# Patient Record
Sex: Female | Born: 1990 | Race: Black or African American | Hispanic: No | Marital: Single | Smoking: Never smoker
Health system: Southern US, Community
[De-identification: ages and names within clinical notes are randomized; demographics above are authoritative.]

## PROBLEM LIST (undated history)

## (undated) DIAGNOSIS — R7303 Prediabetes: Secondary | ICD-10-CM

## (undated) HISTORY — DX: Prediabetes: R73.03

---
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
## 2012-05-07 ENCOUNTER — Encounter (HOSPITAL_BASED_OUTPATIENT_CLINIC_OR_DEPARTMENT_OTHER): Payer: Self-pay | Admitting: Emergency Medicine

## 2012-05-07 ENCOUNTER — Emergency Department (HOSPITAL_BASED_OUTPATIENT_CLINIC_OR_DEPARTMENT_OTHER): Payer: No Typology Code available for payment source

## 2012-05-07 ENCOUNTER — Emergency Department (HOSPITAL_BASED_OUTPATIENT_CLINIC_OR_DEPARTMENT_OTHER)
Admission: EM | Admit: 2012-05-07 | Discharge: 2012-05-07 | Disposition: A | Discharge status: Home / Self Care | Payer: No Typology Code available for payment source | Source: Home / Self Care | Attending: Emergency Medicine | Admitting: Emergency Medicine

## 2012-05-07 ENCOUNTER — Emergency Department (HOSPITAL_BASED_OUTPATIENT_CLINIC_OR_DEPARTMENT_OTHER)
Admission: EM | Admit: 2012-05-07 | Discharge: 2012-05-07 | Disposition: A | Discharge status: Home / Self Care | Payer: No Typology Code available for payment source | Attending: Emergency Medicine | Admitting: Emergency Medicine

## 2012-05-07 DIAGNOSIS — M25539 Pain in unspecified wrist: Secondary | ICD-10-CM

## 2012-05-07 DIAGNOSIS — T148XXA Other injury of unspecified body region, initial encounter: Secondary | ICD-10-CM | POA: Insufficient documentation

## 2012-05-07 DIAGNOSIS — M542 Cervicalgia: Secondary | ICD-10-CM | POA: Insufficient documentation

## 2012-05-07 DIAGNOSIS — R079 Chest pain, unspecified: Secondary | ICD-10-CM | POA: Insufficient documentation

## 2012-05-07 MED ORDER — METHOCARBAMOL 500 MG PO TABS: 500.0000 mg | ORAL_TABLET | Freq: Two times a day (BID) | ORAL | Status: AC

## 2012-05-07 MED ORDER — IBUPROFEN 800 MG PO TABS: 800.0000 mg | ORAL_TABLET | Freq: Three times a day (TID) | ORAL | Status: AC

## 2012-05-07 MED ORDER — ALBUTEROL SULFATE (5 MG/ML) 0.5% IN NEBU
INHALATION_SOLUTION | RESPIRATORY_TRACT | Status: AC
  Filled 2012-05-07: qty 1

## 2012-05-07 MED ORDER — IBUPROFEN 400 MG PO TABS
400.0000 mg | ORAL_TABLET | Freq: Once | ORAL | Status: AC
  Administered 2012-05-07: 400 mg via ORAL
  Filled 2012-05-07: qty 1

## 2012-05-07 MED ORDER — IPRATROPIUM BROMIDE 0.02 % IN SOLN
RESPIRATORY_TRACT | Status: AC
  Filled 2012-05-07: qty 2.5
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
## 2012-05-08 NOTE — ED Provider Notes (Signed)
Medical screening examination/treatment/procedure(s) were conducted as a shared visit with non-physician practitioner(s) and myself.  I personally evaluated the patient during the encounter   Jo Sprout, MD 05/08/12 1154
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
## 2014-11-24 ENCOUNTER — Emergency Department (INDEPENDENT_AMBULATORY_CARE_PROVIDER_SITE_OTHER)
Admission: EM | Admit: 2014-11-24 | Discharge: 2014-11-24 | Disposition: A | Discharge status: Home / Self Care | Payer: Medicaid Other | Source: Home / Self Care | Attending: Emergency Medicine | Admitting: Emergency Medicine

## 2014-11-24 ENCOUNTER — Encounter (HOSPITAL_COMMUNITY): Payer: Self-pay | Admitting: *Deleted

## 2014-11-24 DIAGNOSIS — J069 Acute upper respiratory infection, unspecified: Secondary | ICD-10-CM

## 2014-11-24 DIAGNOSIS — H6092 Unspecified otitis externa, left ear: Secondary | ICD-10-CM

## 2014-11-24 MED ORDER — HYDROCORTISONE-ACETIC ACID 1-2 % OT SOLN: 3.0000 [drp] | Freq: Three times a day (TID) | OTIC | Status: DC

## 2014-11-24 NOTE — Discharge Instructions (Signed)
Acetic Acid Otic Solution Use Acetic acid is used to treatment infections of the outer ear caused by bacteria and fungi. It should not be used with a perforated eardrum. Use only in the ear. It usually helps relieve the itching and discomfort that comes from an outer ear infection. PROCEDURE  Insert the drops when they are warm or at room temperature. In adults, hold the ear up and back. With children, pull the earlobe down and back. This makes it easier for the drops to go in. Then lie with the affected side up for about 5 minutes.  Getting help from someone may make the task easier.  You or your caregiver may put in a wick of cotton. This helps the drops get in contact with the infected surfaces. The wick may be saturated with the solution before putting it into the ear canal. Or, drops can be put in after the wick is in. Do whatever is easiest. RISKS & COMPLICATIONS  Stop using the drops immediately if pain or irritation occurs. There may be temporary stinging or burning when the solution is first put into the sore ear. Stop the drops if the discomfort is too bad and notify your caregiver. AFTER THE PROCEDURE  Avoid driving or doing things that could cause you injury if you feel dizziness or other side effects from the medication. HOME CARE INSTRUCTIONS   Leave the wick in for 24 hours or as instructed. Continue the drops as instructed.  Generally you can keep the wick moist by adding 3 to 5 drops of the solution every 4 to 6 hours. In pediatric patients, 3 to 4 drops every 4 to 6 hours may be enough. They have a smaller ear canal.  If a dose is missed, use the missed dose when you remember. If it is almost time for the next dose, skip the missed dose and use the next regularly scheduled dose. SEEK IMMEDIATE MEDICAL CARE IF:  You have an allergic reaction with difficulty breathing or shortness of breath.  You have swelling of the throat, lips, tongue, or face.  You have a rash or  hives.  You have increased drainage or pain from your ear. MAKE SURE YOU:   Understand these instructions.  Will watch your condition.  Will get help right away if you are not doing well or get worse. Document Released: 03/16/2007 Document Revised: 02/21/2012 Document Reviewed: 11/17/2007 [facility] Patient Information 2015 [place], [place]. This information is not intended to replace advice given to you by your health care provider. Make sure you discuss any questions you have with your health care provider.  Otitis Externa Otitis externa is a germ infection in the outer ear. The outer ear is the area from the eardrum to the outside of the ear. Otitis externa is sometimes called "swimmer's ear." HOME CARE  Put drops in the ear as told by your doctor.  Only take medicine as told by your doctor.  If you have diabetes, your doctor may give you more directions. Follow your doctor's directions.  Keep all doctor visits as told. To avoid another infection:  Keep your ear dry. Use the corner of a towel to dry your ear after swimming or bathing.  Avoid scratching or putting things inside your ear.  Avoid swimming in lakes, dirty water, or pools that use a chemical called chlorine poorly.  You may use ear drops after swimming. Combine equal amounts of white vinegar and alcohol in a bottle. Put 3 or 4 drops in  each ear. GET HELP IF:   You have a fever.  Your ear is still red, puffy (swollen), or painful after 3 days.  You still have yellowish-white fluid (pus) coming from the ear after 3 days.  Your redness, puffiness, or pain gets worse.  You have a really bad headache.  You have redness, puffiness, pain, or tenderness behind your ear. MAKE SURE YOU:   Understand these instructions.  Will watch your condition.  Will get help right away if you are not doing well or get worse. Document Released: 05/17/2008 Document Revised: 04/15/2014 Document Reviewed: 12/16/2011 [facility]  Patient Information 2015 [place], [place]. This information is not intended to replace advice given to you by your health care provider. Make sure you discuss any questions you have with your health care provider.  Upper Respiratory Infection, Adult An upper respiratory infection (URI) is also sometimes known as the common cold. The upper respiratory tract includes the nose, sinuses, throat, trachea, and bronchi. Bronchi are the airways leading to the lungs. Most people improve within 1 week, but symptoms can last up to 2 weeks. A residual cough may last even longer.  CAUSES Many different viruses can infect the tissues lining the upper respiratory tract. The tissues become irritated and inflamed and often become very moist. Mucus production is also common. A cold is contagious. You can easily spread the virus to others by oral contact. This includes kissing, sharing a glass, coughing, or sneezing. Touching your mouth or nose and then touching a surface, which is then touched by another person, can also spread the virus. SYMPTOMS  Symptoms typically develop 1 to 3 days after you come in contact with a cold virus. Symptoms vary from person to person. They may include:  Runny nose.  Sneezing.  Nasal congestion.  Sinus irritation.  Sore throat.  Loss of voice (laryngitis).  Cough.  Fatigue.  Muscle aches.  Loss of appetite.  Headache.  Low-grade fever. DIAGNOSIS  You might diagnose your own cold based on familiar symptoms, since most people get a cold 2 to 3 times a year. Your caregiver can confirm this based on your exam. Most importantly, your caregiver can check that your symptoms are not due to another disease such as strep throat, sinusitis, pneumonia, asthma, or epiglottitis. Blood tests, throat tests, and X-rays are not necessary to diagnose a common cold, but they may sometimes be helpful in excluding other more serious diseases. Your caregiver will decide if any further tests are  required. RISKS AND COMPLICATIONS  You may be at risk for a more severe case of the common cold if you smoke cigarettes, have chronic heart disease (such as heart failure) or lung disease (such as asthma), or if you have a weakened immune system. The very young and very old are also at risk for more serious infections. Bacterial sinusitis, middle ear infections, and bacterial pneumonia can complicate the common cold. The common cold can worsen asthma and chronic obstructive pulmonary disease (COPD). Sometimes, these complications can require emergency medical care and may be life-threatening. PREVENTION  The best way to protect against getting a cold is to practice good hygiene. Avoid oral or hand contact with people with cold symptoms. Wash your hands often if contact occurs. There is no clear evidence that vitamin C, vitamin E, echinacea, or exercise reduces the chance of developing a cold. However, it is always recommended to get plenty of rest and practice good nutrition. TREATMENT  Treatment is directed at relieving symptoms. There is  no cure. Antibiotics are not effective, because the infection is caused by a virus, not by bacteria. Treatment may include:  Increased fluid intake. Sports drinks offer valuable electrolytes, sugars, and fluids.  Breathing heated mist or steam (vaporizer or shower).  Eating chicken soup or other clear broths, and maintaining good nutrition.  Getting plenty of rest.  Using gargles or lozenges for comfort.  Controlling fevers with ibuprofen or acetaminophen as directed by your caregiver.  Increasing usage of your inhaler if you have asthma. Zinc gel and zinc lozenges, taken in the first 24 hours of the common cold, can shorten the duration and lessen the severity of symptoms. Pain medicines may help with fever, muscle aches, and throat pain. A variety of non-prescription medicines are available to treat congestion and runny nose. Your caregiver can make  recommendations and may suggest nasal or lung inhalers for other symptoms.  HOME CARE INSTRUCTIONS   Only take over-the-counter or prescription medicines for pain, discomfort, or fever as directed by your caregiver.  Use a warm mist humidifier or inhale steam from a shower to increase air moisture. This may keep secretions moist and make it easier to breathe.  Drink enough water and fluids to keep your urine clear or pale yellow.  Rest as needed.  Return to work when your temperature has returned to normal or as your caregiver advises. You may need to stay home longer to avoid infecting others. You can also use a face mask and careful hand washing to prevent spread of the virus. SEEK MEDICAL CARE IF:   After the first few days, you feel you are getting worse rather than better.  You need your caregiver's advice about medicines to control symptoms.  You develop chills, worsening shortness of breath, or brown or red sputum. These may be signs of pneumonia.  You develop yellow or brown nasal discharge or pain in the face, especially when you bend forward. These may be signs of sinusitis.  You develop a fever, swollen neck glands, pain with swallowing, or white areas in the back of your throat. These may be signs of strep throat. SEEK IMMEDIATE MEDICAL CARE IF:   You have a fever.  You develop severe or persistent headache, ear pain, sinus pain, or chest pain.  You develop wheezing, a prolonged cough, cough up blood, or have a change in your usual mucus (if you have chronic lung disease).  You develop sore muscles or a stiff neck. Document Released: 05/25/2001 Document Revised: 02/21/2012 Document Reviewed: 03/06/2014 [facility] Patient Information 2015 [place], [place]. This information is not intended to replace advice given to you by your health care provider. Make sure you discuss any questions you have with your health care provider.

## 2014-11-24 NOTE — ED Provider Notes (Signed)
CSN: [id number]     Arrival date & time 11/24/14  1145 History   First MD Initiated Contact with Patient 11/24/14 1253     Chief Complaint  Patient presents with  . Otalgia   (Consider location/radiation/quality/duration/timing/severity/associated sxs/prior Treatment) HPI         23 year old female presents complaining of right-sided ear pain. She has had right-sided ear pain for about a week. Ear feels very full and she has difficulty hearing out of the ear. Denies any systemic symptoms. No recent travel or sick contacts. No fever. No cough or shortness of breath.  Past Medical History  Diagnosis Date  . Migraine    History reviewed. No pertinent past surgical history. No family history on file. History  Substance Use Topics  . Smoking status: Never Smoker   . Smokeless tobacco: Not on file  . Alcohol Use: No   OB History    No data available     Review of Systems  Constitutional: Negative for fever and chills.  HENT: Positive for congestion, ear pain, hearing loss and rhinorrhea. Negative for sinus pressure and sore throat.   Respiratory: Negative for cough and shortness of breath.   All other systems reviewed and are negative.   Allergies  Pork-derived products  Home Medications   Prior to Admission medications   Medication Sig Start Date End Date Taking? Authorizing Provider  fluticasone (VERAMYST) 27.5 MCG/SPRAY nasal spray Place 2 sprays into the nose daily.   Yes Historical Provider, MD  acetic acid-hydrocortisone (VOSOL-HC) otic solution Place 3 drops into the right ear 3 (three) times daily. 11/24/14   Graylon GoodZachary H Bassheva Flury, PA-C  famotidine (PEPCID) 20 MG tablet Take 1 tablet (20 mg total) by mouth 2 (two) times daily as needed for heartburn (chest discomfort). 10/18/11 10/17/12  Wendi MayaJamie N Deis, MD  PRESCRIPTION MEDICATION Take 1 tablet by mouth daily. Nilodol (Paracetamol) 500MG      Historical Provider, MD  rizatriptan (MAXALT) 10 MG tablet Take 5 mg by mouth as needed.  For migraines. May repeat in 2 hours if needed    Historical Provider, MD   BP 133/89 mmHg  Pulse 73  Temp(Src) 99.1 F (37.3 C) (Oral)  Resp 18  SpO2 100%  LMP 11/01/2014 (Exact Date) Physical Exam  Constitutional: She is oriented to person, place, and time. Vital signs are normal. She appears well-developed and well-nourished. No distress.  HENT:  Head: Normocephalic and atraumatic.  Right Ear: External ear normal.  Left Ear: External ear normal.  Nose: Nose normal.  Mouth/Throat: Oropharynx is clear and moist. No oropharyngeal exudate.  Cerumen impaction on the right  Eyes: Conjunctivae are normal. Right eye exhibits no discharge. Left eye exhibits no discharge.  Neck: Normal range of motion. Neck supple.  Cardiovascular: Normal rate, regular rhythm and normal heart sounds.   Pulmonary/Chest: Effort normal and breath sounds normal. No respiratory distress. She has no wheezes. She has no rales.  Lymphadenopathy:    She has no cervical adenopathy.  Neurological: She is alert and oriented to person, place, and time. She has normal strength. Coordination normal.  Skin: Skin is warm and dry. No rash noted. She is not diaphoretic.  Psychiatric: She has a normal mood and affect. Judgment normal.  Nursing note and vitals reviewed.   ED Course  Procedures (including critical care time) Labs Review Labs Reviewed - No data to display  Imaging Review No results found.   MDM   1. Otitis externa, left   2. URI (upper respiratory  infection)    She felt much better after having cerumen rinsed out. There is a small amount of exudate now, will treat for otitis externa. Advised DayQuil and NyQuil for URI symptoms. Follow-up when necessary if worsening   Discharge Medication List as of 11/24/2014  1:13 PM    START taking these medications   Details  acetic acid-hydrocortisone (VOSOL-HC) otic solution Place 3 drops into the right ear 3 (three) times daily., Starting 11/24/2014, Until  Discontinued, Normal           Graylon GoodZachary H Jaiona Simien, PA-C 11/24/14 1350

## 2014-11-24 NOTE — ED Notes (Signed)
C/O right earache x 1 wk with drainage.  Has felt slightly feverish.  Has been having her normal allergy-type sxs.  Taking fluticasone nasal spray.

## 2014-11-26 NOTE — ED Notes (Signed)
Pt came in reporting that Medicaid did not cover Vosol solution Per Dr. Artis FlockKindl... Change to Cortisporin suspension. 4 drops to effected [facility] [address]

## 2017-09-27 ENCOUNTER — Ambulatory Visit: Payer: Self-pay

## 2017-09-29 ENCOUNTER — Ambulatory Visit (INDEPENDENT_AMBULATORY_CARE_PROVIDER_SITE_OTHER): Payer: Self-pay | Admitting: Internal Medicine

## 2017-09-29 DIAGNOSIS — Z8249 Family history of ischemic heart disease and other diseases of the circulatory system: Secondary | ICD-10-CM

## 2017-09-29 DIAGNOSIS — Z124 Encounter for screening for malignant neoplasm of cervix: Secondary | ICD-10-CM

## 2017-09-29 DIAGNOSIS — N898 Other specified noninflammatory disorders of vagina: Secondary | ICD-10-CM | POA: Insufficient documentation

## 2017-09-29 DIAGNOSIS — Z833 Family history of diabetes mellitus: Secondary | ICD-10-CM

## 2017-09-29 DIAGNOSIS — G43109 Migraine with aura, not intractable, without status migrainosus: Secondary | ICD-10-CM | POA: Insufficient documentation

## 2017-09-29 DIAGNOSIS — Z0000 Encounter for general adult medical examination without abnormal findings: Secondary | ICD-10-CM | POA: Insufficient documentation

## 2017-09-29 NOTE — Patient Instructions (Signed)
Ms. Jo Harvey,  It was a pleasure to see you today. I will call you in 1-2 days with the results of your testing. Please follow up with your primary doctor in 1 year. If you have any questions or concerns, call our clinic at [phone] or after hours call [phone] and ask for the internal medicine resident on call. Thank you!  - Dr. Antony ContrasGuilloud

## 2017-09-29 NOTE — Progress Notes (Signed)
   CC: Vaginal Discharge  HPI:  Jo Harvey is a 26 y.o. female with past medical history outlined below here to establish care and with complaint of vaginal discharge. For the details of today's visit, please refer to the assessment and plan.  Past Medical History:  Diagnosis Date  . Migraine     Review of Systems  Constitutional: Negative for fever.  Gastrointestinal: Negative for abdominal pain.  Genitourinary: Negative for dysuria.       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 Denies alcohol, tobacco, and illicit drug use.   Family History: Mom with HTN, Dad with DM. Siblings are healthy.   Physical Exam:  Vitals:   09/29/17 1446  BP: 118/73  Pulse: 63  Temp: 98.3 F (36.8 C)  TempSrc: Oral  SpO2: 100%  Weight: 194 lb (88 kg)  Height: 5\' 7"  (1.702 m)    Constitutional: NAD, appears comfortable Cardiovascular: RRR, no murmurs, rubs, or gallops.  Pulmonary/Chest: CTAB, no wheezes, rales, or rhonchi.  GU: Normal external genitalia. Extensive white discharge in the vaginal canal and vault. Cervix appears normal. No lesions.  Psychiatric: Normal mood and affect  Assessment & Plan:   See Encounters Tab for problem based charting.  Patient discussed with Dr. Criselda PeachesMullen

## 2017-09-30 LAB — CERVICOVAGINAL ANCILLARY ONLY
Bacterial vaginitis: NEGATIVE
Candida vaginitis: POSITIVE — AB
Chlamydia: NEGATIVE
NEISSERIA GONORRHEA: NEGATIVE
Trichomonas: NEGATIVE

## 2017-09-30 LAB — CYTOLOGY - PAP: Diagnosis: NEGATIVE

## 2017-09-30 MED ORDER — FLUCONAZOLE 150 MG PO TABS: 150.0000 mg | ORAL_TABLET | ORAL | 0 refills | Status: DC

## 2017-09-30 NOTE — Assessment & Plan Note (Addendum)
Patient is here with complaint of vaginal discharge. She denies itching or pain. She is not sexually active, reports she has never had vaginal intercourse. She has never had a PAP smear. Pelvic exam today revealed excessive thick white discharge in the vaginal vault. Will f/u wet prep results.  -- F/u PAP smear cytology -- F/u wet prep, GC/Chlamydia, trichomonas   ADDENDUM: PAP smear negative for intra-epithelial lesions or malignancy. Positive for Candida. Call patient and left VM with results. [facility] [address]. Instructed to call back with any questions.

## 2017-09-30 NOTE — Progress Notes (Signed)
Internal Medicine Clinic Attending  Case discussed with Dr. Guilloud at the time of the visit.  We reviewed the resident's history and exam and pertinent patient test results.  I agree with the assessment, diagnosis, and plan of care documented in the resident's note.  

## 2017-09-30 NOTE — Assessment & Plan Note (Signed)
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 -- OTC NSAIDS as needed

## 2017-09-30 NOTE — Addendum Note (Signed)
Addended by: Burnell BlanksGUILLOUD, Kriss Ishler R on: 09/30/2017 04:29 PM   Modules accepted: Orders

## 2018-02-20 ENCOUNTER — Ambulatory Visit: Payer: Self-pay | Admitting: Internal Medicine

## 2018-02-20 ENCOUNTER — Encounter (INDEPENDENT_AMBULATORY_CARE_PROVIDER_SITE_OTHER): Payer: Self-pay

## 2018-02-20 VITALS — BP 129/74 | HR 76 | Temp 98.1°F | Ht 67.0 in | Wt 197.7 lb

## 2018-02-20 DIAGNOSIS — R350 Frequency of micturition: Secondary | ICD-10-CM | POA: Insufficient documentation

## 2018-02-20 DIAGNOSIS — R319 Hematuria, unspecified: Secondary | ICD-10-CM

## 2018-02-20 DIAGNOSIS — R17 Unspecified jaundice: Secondary | ICD-10-CM | POA: Insufficient documentation

## 2018-02-20 DIAGNOSIS — D573 Sickle-cell trait: Secondary | ICD-10-CM

## 2018-02-20 NOTE — Patient Instructions (Signed)
Thank you for visiting clinic today. We will do some lab work today-we will call you with results. Keep yourself well-hydrated and drink plenty of fluid. We might have to do further workup depending on your current lab results.

## 2018-02-20 NOTE — Progress Notes (Signed)
   CC: Increased urinary frequency with dark colored urine and strong odor for the last 3 days.  HPI:  Ms.Sharunda Camila LiOsman is a 27 y.o. with no significant past medical history came to the clinic with complaint of increased urinary frequency with dark colored urine and strong odor for the past 3-4 days.  Patient was accompanied by her mother who also noticed that her eyes are turning yellow for the past few days.  Patient was having decreased in appetite for the past 2-3 weeks, denies any nausea, vomiting, abdominal pain, diarrhea, constipation or fever.  She denies any recent illness.  No recent change in her weight.   She denies any burning micturition or hematuria.  She has noticed some right flank pain for the past few days, pain is very mild, intermittent and does not interfere with her activities.  She denies any lower abdominal pain. She denies any blood transfusions.    According to patient that she was told that she has sickle cell trait, but never had any problem in the past.  She has regular cycles and her last periods were on February 12, 2018.  She denies any vaginal bleeding or discharge at this time.  Past Medical History:  Diagnosis Date  . Migraine    Review of Systems: Negative except mentioned in HPI.  Physical Exam:  Vitals:   02/20/18 1400  BP: 129/74  Pulse: 76  Temp: 98.1 F (36.7 C)  TempSrc: Oral  SpO2: 100%  Weight: 197 lb 11.2 oz (89.7 kg)  Height: 5\' 7"  (1.702 m)    General: Vital signs reviewed.  Patient is well-developed and well-nourished, in no acute distress and cooperative with exam.  Head: Normocephalic and atraumatic. Eyes: EOMI, conjunctivae normal, mild scleral icterus.  Neck: Supple, trachea midline, normal ROM, no JVD, masses, thyromegaly, or carotid bruit present.  Cardiovascular: RRR, S1 normal, S2 normal, no murmurs, gallops, or rubs. Pulmonary/Chest: Clear to auscultation bilaterally, no wheezes, rales, or rhonchi. Abdominal: Soft, mild  right flank tenderness, non-distended, BS +, no masses, organomegaly, or guarding present.  Extremities: No lower extremity edema bilaterally,  pulses symmetric and intact bilaterally. No cyanosis or clubbing. Skin: Warm, dry and intact. No rashes or erythema. Psychiatric: Normal mood and affect. speech and behavior is normal. Cognition and memory are normal.  Assessment & Plan:   See Encounters Tab for problem based charting.  Patient discussed with Dr. Rogelia BogaButcher.

## 2018-02-20 NOTE — Assessment & Plan Note (Addendum)
She was having mild scleral icterus and the mother noticed a change in the color of her eyes as they are becoming more yellow.  Only pertinent positive at this time was decreased appetite for the past 2-3 weeks.  No other GI symptoms.  No recent illness. No right upper quadrant pain-she was complaining of mild right flank pain mostly on the right upper lateral side of her abdomen.  -We will check CBC and CMP. -If abnormal liver testing-we will check for hepatitis panel along with haptoglobin and LDH.  Addendum.  CBC with very mild microcytosis and CMP completely normal-no need for any further evaluation.

## 2018-02-20 NOTE — Assessment & Plan Note (Addendum)
Urinary dipstick performed in clinic shows moderate blood with no nitrite or leukocytes.  We will check UA with microscopy. Check can be sent if needed. It was advised to keep herself well-hydrated, as she was afebrile with very mild symptoms at this time.  Addendum.  Her UA is only positive for hematuria with very few bacteria, no leukocytosis or nitrites. We will treat her with a 3-day course of Bactrim-called the patient [facility] Patient will return next week for a repeat UA.

## 2018-02-21 ENCOUNTER — Telehealth: Payer: Self-pay | Admitting: Internal Medicine

## 2018-02-21 LAB — CMP14 + ANION GAP
A/G RATIO: 1.5 (ref 1.2–2.2)
ALBUMIN: 4.4 g/dL (ref 3.5–5.5)
ALT: 12 IU/L (ref 0–32)
AST: 11 IU/L (ref 0–40)
Alkaline Phosphatase: 62 IU/L (ref 39–117)
Anion Gap: 13 mmol/L (ref 10.0–18.0)
BUN / CREAT RATIO: 20 (ref 9–23)
BUN: 13 mg/dL (ref 6–20)
CHLORIDE: 101 mmol/L (ref 96–106)
CO2: 23 mmol/L (ref 20–29)
Calcium: 10 mg/dL (ref 8.7–10.2)
Creatinine, Ser: 0.66 mg/dL (ref 0.57–1.00)
GFR calc Af Amer: 143 mL/min/{1.73_m2} (ref >59)
GFR calc non Af Amer: 124 mL/min/{1.73_m2} (ref >59)
GLOBULIN, TOTAL: 2.9 g/dL (ref 1.5–4.5)
GLUCOSE: 83 mg/dL (ref 65–99)
POTASSIUM: 4.8 mmol/L (ref 3.5–5.2)
SODIUM: 137 mmol/L (ref 134–144)
Total Protein: 7.3 g/dL (ref 6.0–8.5)

## 2018-02-21 LAB — CBC WITH DIFFERENTIAL/PLATELET
BASOS ABS: 0 10*3/uL (ref 0.0–0.2)
Basos: 0 %
EOS (ABSOLUTE): 0.1 10*3/uL (ref 0.0–0.4)
Eos: 1 %
Hematocrit: 38 % (ref 34.0–46.6)
Hemoglobin: 12.4 g/dL (ref 11.1–15.9)
Immature Grans (Abs): 0 10*3/uL (ref 0.0–0.1)
Immature Granulocytes: 0 %
LYMPHS ABS: 2.2 10*3/uL (ref 0.7–3.1)
Lymphs: 31 %
MCH: 25.6 pg — AB (ref 26.6–33.0)
MCHC: 32.6 g/dL (ref 31.5–35.7)
MCV: 78 fL — AB (ref 79–97)
MONOS ABS: 0.5 10*3/uL (ref 0.1–0.9)
Monocytes: 7 %
NEUTROS ABS: 4.2 10*3/uL (ref 1.4–7.0)
Neutrophils: 61 %
PLATELETS: 329 10*3/uL (ref 150–379)
RBC: 4.85 x10E6/uL (ref 3.77–5.28)
RDW: 14.6 % (ref 12.3–15.4)
WBC: 7 10*3/uL (ref 3.4–10.8)

## 2018-02-21 LAB — MICROSCOPIC EXAMINATION: Casts: NONE SEEN /lpf

## 2018-02-21 LAB — URINALYSIS, ROUTINE W REFLEX MICROSCOPIC
Bilirubin, UA: NEGATIVE
Glucose, UA: NEGATIVE
KETONES UA: NEGATIVE
LEUKOCYTES UA: NEGATIVE
Nitrite, UA: NEGATIVE
Protein, UA: NEGATIVE
SPEC GRAV UA: 1.023 (ref 1.005–1.030)
Urobilinogen, Ur: 0.2 mg/dL (ref 0.2–1.0)
pH, UA: 5 (ref 5.0–7.5)

## 2018-02-21 MED ORDER — SULFAMETHOXAZOLE-TRIMETHOPRIM 800-160 MG PO TABS: 1.0000 | ORAL_TABLET | Freq: Two times a day (BID) | ORAL | 0 refills | Status: AC

## 2018-02-21 NOTE — Telephone Encounter (Signed)
She can go on good Rx website and get a coupon for Bactrim and [facility]

## 2018-02-21 NOTE — Progress Notes (Signed)
Internal Medicine Clinic Attending  Case discussed with Dr. Amin at the time of the visit.  We reviewed the resident's history and exam and pertinent patient test results.  I agree with the assessment, diagnosis, and plan of care documented in the resident's note.    

## 2018-02-21 NOTE — Telephone Encounter (Signed)
Called /informed pt  "She can go on good Rx website and get a coupon for Bactrim and [facility] per Dr Nelson ChimesAmin. Stated she will and thank-you.

## 2018-02-21 NOTE — Addendum Note (Signed)
Addended by: Arnetha CourserAMIN, Ronika Kelson on: 02/21/2018 08:54 AM   Modules accepted: Orders

## 2018-02-21 NOTE — Telephone Encounter (Signed)
Patient was just talking Dr Nelson ChimesAmin and she is suppose to put her in some prescription . She wanted to let the Dr know that she doesn't have insurance so if she can put her something in that's no so expensive.

## 2018-02-28 ENCOUNTER — Ambulatory Visit: Payer: Self-pay | Admitting: Internal Medicine

## 2018-02-28 ENCOUNTER — Encounter: Payer: Self-pay | Admitting: Internal Medicine

## 2018-02-28 ENCOUNTER — Other Ambulatory Visit: Payer: Self-pay

## 2018-02-28 VITALS — BP 130/70 | HR 101 | Temp 98.2°F | Ht 67.0 in | Wt 195.0 lb

## 2018-02-28 DIAGNOSIS — H5789 Other specified disorders of eye and adnexa: Secondary | ICD-10-CM

## 2018-02-28 DIAGNOSIS — R3129 Other microscopic hematuria: Secondary | ICD-10-CM

## 2018-02-28 DIAGNOSIS — R350 Frequency of micturition: Secondary | ICD-10-CM

## 2018-02-28 HISTORY — DX: Other microscopic hematuria: R31.29

## 2018-02-28 NOTE — Progress Notes (Signed)
   CC: For follow-up of her hematuria.  HPI:  [name] is a 27 y.o. with no significant past medical history came to the clinic for follow-up of her recent hematuria. She was seen last week with complaint consistent with UTI and her urine shows only microscopic hematuria with no leukocytosis or bacteria, she was treated with a 3 days course of Bactrim. Today her symptoms has been improved and she is no more experiencing increased urinary frequency. She continued to have some Mcghee eyes. Denies any nausea vomiting or abdominal pain.  Appetite is normal.  Past Medical History:  Diagnosis Date  . Migraine    Review of Systems: Negative except mentioned in HPI.  Physical Exam:  Vitals:   02/28/18 1522  BP: 130/70  Pulse: (!) 101  Temp: 98.2 F (36.8 C)  TempSrc: Oral  SpO2: 100%  Weight: 195 lb (88.5 kg)  Height: 5\' 7"  (1.702 m)   General: Vital signs reviewed.  Patient is well-developed and well-nourished, in no acute distress and cooperative with exam.  Head: Normocephalic and atraumatic. Eyes: EOMI, conjunctivae normal, very mild scleral icterus, no change from previous exam. Cardiovascular: RRR, S1 normal, S2 normal, no murmurs, gallops, or rubs. Pulmonary/Chest: Clear to auscultation bilaterally, no wheezes, rales, or rhonchi. Abdominal: Soft, non-tender, non-distended, BS +, no masses, organomegaly, or guarding present.  Extremities: No lower extremity edema bilaterally,  pulses symmetric and intact bilaterally. No cyanosis or clubbing. Skin: Warm, dry and intact. No rashes or erythema. Psychiatric: Normal mood and affect. speech and behavior is normal. Cognition and memory are normal.  Assessment & Plan:   See Encounters Tab for problem based charting.  Patient discussed with Dr. Rogelia BogaButcher.

## 2018-02-28 NOTE — Assessment & Plan Note (Addendum)
Microscopic hematuria was most likely due to cystitis. We will repeat UA To see resolution of her microscopic hematuria after completing a 3-day course of Bactrim.  If she has persistent hematuria, she will need further evaluation.  Addendum.  Her hematuria is improving, she just had 8-10 RBCs on repeat UA, improved than before.  Most likely due to cystitis as her symptoms are improving too. Can repeat UA during next follow-up visit to see complete resolution.

## 2018-02-28 NOTE — Assessment & Plan Note (Signed)
Her symptoms has been resolved. She did completed a 3-day course of Bactrim.  We will repeat UA to see resolution of hematuria. If she continued to have hematuria she will need further evaluation.

## 2018-02-28 NOTE — Patient Instructions (Addendum)
Thank you for visiting clinic today. We will check your urine and will call you for any abnormal results. Please keep yourself well-hydrated and exercise regularly. Losing some weight will help keeping your blood pressure under good control. You can call clinic or visit us again if your symptoms recur.

## 2018-03-01 LAB — URINALYSIS, ROUTINE W REFLEX MICROSCOPIC
Bilirubin, UA: NEGATIVE
Glucose, UA: NEGATIVE
Ketones, UA: NEGATIVE
LEUKOCYTES UA: NEGATIVE
Nitrite, UA: NEGATIVE
PH UA: 5 (ref 5.0–7.5)
Protein, UA: NEGATIVE
Specific Gravity, UA: 1.017 (ref 1.005–1.030)
Urobilinogen, Ur: 0.2 mg/dL (ref 0.2–1.0)

## 2018-03-01 LAB — MICROSCOPIC EXAMINATION: CASTS: NONE SEEN /LPF

## 2018-03-01 NOTE — Progress Notes (Signed)
Internal Medicine Clinic Attending  Case discussed with Dr. Nelson ChimesAmin at the time of the visit.  We reviewed the resident's history and exam and pertinent patient test results.  I agree with the assessment, diagnosis, and plan of care documented in the resident's note. We were never convinced that she had a UTI. After ABX, microscopic hematuria persisted. I agree with repeat UA but would spin urine for microscopy and check urine pregnancy.

## 2018-03-01 NOTE — Progress Notes (Signed)
Internal Medicine Clinic Attending  Case discussed with Dr. Amin at the time of the visit.  We reviewed the resident's history and exam and pertinent patient test results.  I agree with the assessment, diagnosis, and plan of care documented in the resident's note.    

## 2018-03-08 ENCOUNTER — Ambulatory Visit: Payer: Self-pay

## 2019-06-11 ENCOUNTER — Encounter: Payer: Self-pay | Admitting: *Deleted

## 2019-07-05 ENCOUNTER — Encounter: Payer: Self-pay | Admitting: Internal Medicine

## 2019-07-05 ENCOUNTER — Other Ambulatory Visit: Payer: Self-pay

## 2019-07-05 ENCOUNTER — Ambulatory Visit: Payer: 59 | Admitting: Internal Medicine

## 2019-07-05 ENCOUNTER — Telehealth: Payer: Self-pay | Admitting: *Deleted

## 2019-07-05 VITALS — BP 115/85 | HR 79 | Temp 98.5°F | Ht 67.0 in | Wt 184.3 lb

## 2019-07-05 DIAGNOSIS — G629 Polyneuropathy, unspecified: Secondary | ICD-10-CM

## 2019-07-05 DIAGNOSIS — R358 Other polyuria: Secondary | ICD-10-CM | POA: Diagnosis not present

## 2019-07-05 DIAGNOSIS — L988 Other specified disorders of the skin and subcutaneous tissue: Secondary | ICD-10-CM

## 2019-07-05 LAB — GLUCOSE, CAPILLARY: Glucose-Capillary: 86 mg/dL (ref 70–99)

## 2019-07-05 LAB — POCT GLYCOSYLATED HEMOGLOBIN (HGB A1C): Hemoglobin A1C: 6 % — AB (ref 4.0–5.6)

## 2019-07-05 NOTE — Assessment & Plan Note (Addendum)
This is a new problem that started 5 months ago but she has not discussed that yet.   Jo Harvey noticed progressive discoloration (darkening) of her feet 5 months ago that sometimes associated with feeling cold on the area. She then developed darkening and occasional numbness of her fingers that progressed proximally up to mid hands. The numbness comes and goes but skin darkening was constant.  Exposure to cold does not make any difference. She denies any other associated symptoms. On exam, she has slight difference in sensation of right and left hand. (R>L). Also 2 point discrimination is diminished on feet.  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 May need NCV in future if lab result will be unremarkable.   -HbA1c -Renal function panel -HIV Ab -ESR and CRP -B12  -RPR -F/u in clinic in 2-4 weeks for f/u  ADDENDUM: Lab result unremarkable except Hb A1c:6. Prediabetes may be associated with her symptoms. She will follow up in clinic in 2 weeks. May consider starting Metformin (and see if her symptoms improve) vs referral to neurology for nerve conduction studies when gets insurance.

## 2019-07-05 NOTE — Progress Notes (Signed)
   CC: skin darkening and numbness  HPI:  Jo Harvey is a 28 y.o. with PMHx as documented below, presented with few months Hx of feet and hands darkening of . Please refer to problem based charting for further details and assessment and plan of current problem and chronic medical conditions.  Past Medical History:  Diagnosis Date  . Migraine    Review of Systems: Review of Systems  Constitutional: Negative for chills, fever and weight loss.  Respiratory: Negative for cough and shortness of breath.   Gastrointestinal: Negative for abdominal pain, constipation and diarrhea.  Genitourinary:       Polyuria  Skin: Negative for rash.  Neurological: Positive for sensory change. Negative for focal weakness.  Endo/Heme/Allergies: Positive for polydipsia.   FHx: Parents have DM  Social Hx: Patient lives with her family. She is unmarried. Works as Passenger transport manager. No pets at home. No Hx of smoking, alcohol use or illicit drug use.  Physical Exam:  Vitals:   07/05/19 1530  BP: 115/85  Pulse: 79  Temp: 98.5 F (36.9 C)  TempSrc: Oral  SpO2: 100%  Weight: 184 lb 4.8 oz (83.6 kg)  Height: 5\' 7"  (1.702 m)   Physical Exam Constitutional:      General: She is not in acute distress.    Appearance: Normal appearance. She is not ill-appearing.  Cardiovascular:     Rate and Rhythm: Normal rate and regular rhythm.     Pulses: Normal pulses.     Heart sounds: Normal heart sounds. No murmur.  Pulmonary:     Effort: Pulmonary effort is normal.     Breath sounds: Normal breath sounds. No wheezing or rales.  Musculoskeletal:     Comments: Trace lower extremity edema  Skin:    General: Skin is warm and dry.     Findings: No rash.     Comments: Skin darkening at distals  Neurological:     Mental Status: She is alert.     Sensory: Sensory deficit present.     Motor: No weakness.     Assessment & Plan:   See Encounters Tab for problem based charting.  Patient discussed with Dr. Rebeca Alert

## 2019-07-05 NOTE — Telephone Encounter (Signed)
Call from pt /requesting an appt  - stated her hands and feet are "very cold" and discolored which started in April. At first she thought it was a tan line but did not go away and has became worse.  Stated her feet swell sometimes but not currently "just cold". Denies having any pain. [facility] appt scheduled for today @ 1515 PM.

## 2019-07-05 NOTE — Patient Instructions (Signed)
It was our pleasure taking care of you in our clinic today.  I send you to lab for some blood work to work up for the numbness and skin changes. Please come back to our clinic, in 2-4 weeks for follow up if no improvement or if your symptoms gets worse. Please call us at [phone] if you have any questions or concerns.  Thank you,  Dr. Myrtie Hawk

## 2019-07-05 NOTE — Telephone Encounter (Signed)
Agreed, thank you

## 2019-07-06 LAB — RENAL FUNCTION PANEL
Albumin: 4.5 g/dL (ref 3.9–5.0)
BUN/Creatinine Ratio: 19 (ref 9–23)
BUN: 13 mg/dL (ref 6–20)
CO2: 21 mmol/L (ref 20–29)
Calcium: 9.9 mg/dL (ref 8.7–10.2)
Chloride: 100 mmol/L (ref 96–106)
Creatinine, Ser: 0.69 mg/dL (ref 0.57–1.00)
GFR calc Af Amer: 140 mL/min/{1.73_m2} (ref >59)
GFR calc non Af Amer: 121 mL/min/{1.73_m2} (ref >59)
Glucose: 82 mg/dL (ref 65–99)
Phosphorus: 4.4 mg/dL — ABNORMAL HIGH (ref 3.0–4.3)
Potassium: 4.8 mmol/L (ref 3.5–5.2)
Sodium: 137 mmol/L (ref 134–144)

## 2019-07-06 LAB — SEDIMENTATION RATE: Sed Rate: 30 mm/hr (ref 0–32)

## 2019-07-06 LAB — HIV ANTIBODY (ROUTINE TESTING W REFLEX): HIV Screen 4th Generation wRfx: NONREACTIVE

## 2019-07-06 LAB — RPR: RPR Ser Ql: NONREACTIVE

## 2019-07-06 LAB — C-REACTIVE PROTEIN: CRP: 4 mg/L (ref 0–10)

## 2019-07-06 LAB — VITAMIN B12: Vitamin B-12: 655 pg/mL (ref 232–1245)

## 2019-07-06 NOTE — Progress Notes (Signed)
Internal Medicine Clinic Attending  Case discussed with Dr. Myrtie Hawk at the time of the visit.  We reviewed the resident's history and exam and pertinent patient test results.  I agree with the assessment, diagnosis, and plan of care documented in the resident's note.  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 A1C suggests prediabetes, which can be associated with neuropathy. Nerve conduction studies could be helpful, but likely cost prohibitive with no insurance. Will consider starting metformin and seeing how she responds vs neurology referral and NCS.  Lenice Pressman, M.D., Ph.D.

## 2019-07-11 ENCOUNTER — Telehealth: Payer: Self-pay | Admitting: Internal Medicine

## 2019-07-11 NOTE — Telephone Encounter (Signed)
Pt requesting her Lab Work Results.

## 2019-07-13 NOTE — Telephone Encounter (Signed)
-----   Message from Dewayne Hatch, MD sent at 07/13/2019 12:42 PM EDT ----- I called patient and updated her for lab result. Per last plan, will call to get follow up appointment in clinic in 2-4 weeks.

## 2020-04-21 ENCOUNTER — Encounter: Payer: Self-pay | Admitting: *Deleted

## 2020-09-04 ENCOUNTER — Encounter: Payer: 59 | Admitting: Student

## 2021-04-16 ENCOUNTER — Other Ambulatory Visit: Payer: Self-pay

## 2021-04-16 ENCOUNTER — Ambulatory Visit (INDEPENDENT_AMBULATORY_CARE_PROVIDER_SITE_OTHER): Payer: 59 | Admitting: Student

## 2021-04-16 ENCOUNTER — Encounter: Payer: Self-pay | Admitting: Student

## 2021-04-16 VITALS — BP 139/80 | HR 100 | Temp 98.3°F | Ht 67.0 in | Wt 211.7 lb

## 2021-04-16 DIAGNOSIS — G629 Polyneuropathy, unspecified: Secondary | ICD-10-CM | POA: Diagnosis not present

## 2021-04-16 DIAGNOSIS — R7303 Prediabetes: Secondary | ICD-10-CM

## 2021-04-16 DIAGNOSIS — E119 Type 2 diabetes mellitus without complications: Secondary | ICD-10-CM | POA: Diagnosis not present

## 2021-04-16 DIAGNOSIS — Z0000 Encounter for general adult medical examination without abnormal findings: Secondary | ICD-10-CM

## 2021-04-16 DIAGNOSIS — R599 Enlarged lymph nodes, unspecified: Secondary | ICD-10-CM | POA: Diagnosis not present

## 2021-04-16 DIAGNOSIS — D509 Iron deficiency anemia, unspecified: Secondary | ICD-10-CM

## 2021-04-16 LAB — POCT GLYCOSYLATED HEMOGLOBIN (HGB A1C): Hemoglobin A1C: 6.4 % — AB (ref 4.0–5.6)

## 2021-04-16 LAB — GLUCOSE, CAPILLARY: Glucose-Capillary: 108 mg/dL — ABNORMAL HIGH (ref 70–99)

## 2021-04-16 MED ORDER — METFORMIN HCL 1000 MG PO TABS: 1000.0000 mg | ORAL_TABLET | Freq: Every day | ORAL | 3 refills | Status: DC

## 2021-04-16 NOTE — Assessment & Plan Note (Addendum)
A1c check 6.4 today.  Patient is taking metformin 500 mg daily, prescribed by a physician who did her annual physical last June.  States that she sometimes forgets to take her metformin.  States that she is working on lifestyle modification.  She has been cutting off on drinking sodas and trying to exercise 30 minutes a day.  Reports that she is still eating rice and fried food.  Assessment and plan Her A1c is at borderline today.  Looks that her weights has been going up.  Her blood pressure is also on the higher side.  It is good that she is working on lifestyle modification and hopefully with weight loss she will not have to be on medications.  We discussed the benefit of GLP-1 medication and for diabetes and weight loss.  Patient would like to think about it at this time.  -Encourage continued lifestyle modification -Increase metformin to 1000 mg daily -A1c in 3 months -Discuss GLP-1 again next visit.

## 2021-04-16 NOTE — Assessment & Plan Note (Signed)
Last CBC in 03/2020 showed mild microcytic anemia.  Unfortunately we did not address this problem during this visit.  -Will need to repeat CBC at next visit.

## 2021-04-16 NOTE — Assessment & Plan Note (Signed)
Patient reports 2 palpable masses in the inguinal area that she noted 2 months ago.  States is not tender to palpation.  States that is decreasing size, specially with drinking arnica tea and lymphatic massage.  She denies abdominal pain, constipation, diarrhea, urinary symptoms.  States that she is not sexually active and has not had intercourse.  Denies any vaginal discharge pain or itchiness.  Assessment and plan There are 2 small palpable lymph nodes approximately 2 cm in diameter palpated inferior to the inguinal canal.  There is smooth surfaced and movable.  This consistent with lymph node enlargement.  No concern for malignancy at this time.  We will continue to monitor.  Patient is counseled on red flags such as rapidly increasing size or rough surface.  -Reevaluate next visit

## 2021-04-16 NOTE — Patient Instructions (Signed)
Jo Harvey,  It was nice seeing you in the clinic today.  Here is a summary of what we talked about:  1.  Diabetes: I will increase your metformin to 1000 mg.  Please work on lifestyle modification with healthy diet and exercise.  We can talk about GLP-1 medication for weight loss benefit at next visit.  2.  Reactive lymph nodes: We will keep monitoring.  Take care  Dr. Cyndie Chime

## 2021-04-16 NOTE — Progress Notes (Signed)
   CC: Diabetes follow-up  HPI:  [name] is a 30 y.o. with past medical history of migraine who presented to the clinic today for hemoglobin A1c checked.  Please see problem based charting for further detail  Past Medical History:  Diagnosis Date  . Migraine    Review of Systems: As per HPI  Physical Exam:  Vitals:   04/16/21 1553  BP: 139/80  Pulse: 100  Temp: 98.3 F (36.8 C)  TempSrc: Oral  Weight: 211 lb 11.2 oz (96 kg)  Height: 5\' 7"  (1.702 m)   Physical Exam Constitutional:      General: She is not in acute distress.    Appearance: She is not toxic-appearing.  HENT:     Head: Normocephalic.  Eyes:     General: No scleral icterus.       Right eye: No discharge.        Left eye: No discharge.     Conjunctiva/sclera: Conjunctivae normal.  Cardiovascular:     Rate and Rhythm: Normal rate and regular rhythm.     Pulses: Normal pulses.     Heart sounds: No murmur heard.   Pulmonary:     Effort: Pulmonary effort is normal. No respiratory distress.     Breath sounds: Normal breath sounds. No wheezing.  Abdominal:     General: Bowel sounds are normal.  Musculoskeletal:        General: Normal range of motion.     Cervical back: Normal range of motion and neck supple.     Comments: 2 small palpable masses approximately 2 cm in diameter palpated inferior to the inguinal canal.  Surface is smooth.  Movable.  No tender to palpation.  Skin:    General: Skin is warm.  Neurological:     General: No focal deficit present.     Mental Status: She is alert.  Psychiatric:        Mood and Affect: Mood normal.        Thought Content: Thought content normal.        Judgment: Judgment normal.     Assessment & Plan:   See Encounters Tab for problem based charting.  Patient discussed with Dr. 

## 2021-04-16 NOTE — Assessment & Plan Note (Addendum)
Last Pap smear was in 2018 which was normal.  She is due for Pap smear today.  She would like to have a female physician to perform for her.

## 2021-04-21 NOTE — Progress Notes (Signed)
Internal Medicine Clinic Attending  Case discussed with Dr. Nguyen  At the time of the visit.  We reviewed the resident's history and exam and pertinent patient test results.  I agree with the assessment, diagnosis, and plan of care documented in the resident's note. 

## 2021-05-07 ENCOUNTER — Telehealth: Payer: Self-pay | Admitting: Student

## 2021-05-07 NOTE — Telephone Encounter (Signed)
Patient is requesting a referral to see Tobey Bride for his diabetes.  LOV was 04/16/2021 with you. Please advise if a referral can be placed.

## 2021-07-13 NOTE — Addendum Note (Signed)
Addended byDoran Stabler on: 07/13/2021 08:42 AM   Modules accepted: Orders

## 2021-09-22 ENCOUNTER — Ambulatory Visit (INDEPENDENT_AMBULATORY_CARE_PROVIDER_SITE_OTHER): Payer: 59 | Admitting: Internal Medicine

## 2021-09-22 VITALS — BP 116/86 | HR 71 | Temp 98.4°F | Wt 208.3 lb

## 2021-09-22 DIAGNOSIS — B353 Tinea pedis: Secondary | ICD-10-CM

## 2021-09-22 DIAGNOSIS — R7303 Prediabetes: Secondary | ICD-10-CM | POA: Diagnosis not present

## 2021-09-22 LAB — GLUCOSE, CAPILLARY: Glucose-Capillary: 76 mg/dL (ref 70–99)

## 2021-09-22 LAB — POCT GLYCOSYLATED HEMOGLOBIN (HGB A1C): Hemoglobin A1C: 6.3 % — AB (ref 4.0–5.6)

## 2021-09-22 MED ORDER — CLOTRIMAZOLE 1 % EX CREA: 1.0000 "application " | TOPICAL_CREAM | Freq: Two times a day (BID) | CUTANEOUS | 0 refills | Status: DC

## 2021-09-22 NOTE — Assessment & Plan Note (Signed)
Patient reports 1 week history of discoloration and peeling at the fourth and fifth interdigit spaces of the right foot.  She denies any other symptoms.  Denies any itchiness.  Notes that she frequently has sweaty feet.  On examination, there is white discoloration of the fourth and fifth interdigit spaces with peeling skin.  No erythema or areas of necrosis noted. Physical exam is most consistent with tinea pedis.  Plan Clotrimazole 1% cream twice daily Advised to keep feet as clean and dry as possible

## 2021-09-22 NOTE — Progress Notes (Signed)
   CC: Foot infection  HPI:  [name] is a 30 y.o. female with past medical history of prediabetes presenting with 1 week history of peeling skin in her fourth and fifth interdigit webbing.  She notes that this is mostly peeling and is nontender.  Denies any other symptoms at this time.  She is concerned about a foot infection.  Please see problem discharge for complete assessment plan  Past Medical History:  Diagnosis Date   Migraine    Review of Systems: Negative except as stated in HPI  Physical Exam:  Vitals:   09/22/21 1050  BP: 116/86  Pulse: 71  Temp: 98.4 F (36.9 C)  TempSrc: Oral  SpO2: 100%  Weight: 208 lb 4.8 oz (94.5 kg)   Physical Exam  Constitutional: Appears well-developed and well-nourished. No distress.  HENT: Normocephalic and atraumatic Cardiovascular: Normal rate, regular rhythm, S1 and S2 present, no murmurs, rubs, gallops.  Distal pulses intact Respiratory: Lungs are clear to auscultation bilaterally. Musculoskeletal: Normal bulk and tone.  No peripheral edema noted. Skin: Warm and dry.  Right foot: White discoloration in the 4th and 5th interdigit space with peeling skin; no surrounding erythema or edema  Psychiatric: Normal mood and affect. Behavior is normal. Judgment and thought content normal.    Assessment & Plan:   See Encounters Tab for problem based charting.  Patient discussed with Dr.  Sol Blazing

## 2021-09-22 NOTE — Patient Instructions (Addendum)
Ms Yarimar Lavis,  It was a pleasure seeing you in clinic. Today we discussed:   Prediabetes:  Your A1c is 6.3 today. At this time, I will also send a referral to our diabetes nutrition counselor.   Foot fungus:  At this time, please use clotrimazole 1% cream twice daily. Please keep the area as clean and dry as possible. If no significant improvement, please contact us   If you have any questions or concerns, please call our clinic at [phone] between 9am-5pm and after hours call [phone] and ask for the internal medicine resident on call. If you feel you are having a medical emergency please call 911.   Thank you, we look forward to helping you remain healthy!

## 2021-09-22 NOTE — Assessment & Plan Note (Signed)
HbA1c 6.3 at this visit. Patient has been on metformin 1000mg  daily; however, reports intermittent noncompliance and dietary indiscretion. She is concerned about possibly needing insulin therapy. Advised patient that she does not require insulin therapy at this time. However, would benefit from adherence to metformin and dietary modifications. She is agreeable to this.  Plan: Continue metformin 1000mg  daily  Referral to for diabetes nutrition counseling Microalbumin/cr ratio at this visit F/u A1c in 6 months

## 2021-09-23 LAB — MICROALBUMIN / CREATININE URINE RATIO
Creatinine, Urine: 105.5 mg/dL
Microalb/Creat Ratio: <3 mg/g creat (ref 0–29)
Microalbumin, Urine: <3 ug/mL

## 2021-09-23 NOTE — Progress Notes (Signed)
Internal Medicine Clinic Attending  I saw and evaluated the patient with the resident.  I personally confirmed the key portions of the history and exam documented by Dr. Mcarthur Rossetti and I reviewed pertinent patient test results.  The assessment, diagnosis, and plan were formulated together and I agree with the documentation in the resident's note.

## 2021-12-01 ENCOUNTER — Encounter: Payer: 59 | Admitting: Dietician

## 2022-04-16 ENCOUNTER — Encounter: Payer: Self-pay | Admitting: Internal Medicine

## 2022-04-16 ENCOUNTER — Other Ambulatory Visit: Payer: Self-pay

## 2022-04-16 ENCOUNTER — Ambulatory Visit (INDEPENDENT_AMBULATORY_CARE_PROVIDER_SITE_OTHER): Payer: 59 | Admitting: Internal Medicine

## 2022-04-16 VITALS — BP 135/93 | HR 82 | Temp 98.2°F | Ht 67.0 in | Wt 201.1 lb

## 2022-04-16 DIAGNOSIS — J330 Polyp of nasal cavity: Secondary | ICD-10-CM | POA: Diagnosis not present

## 2022-04-16 DIAGNOSIS — B9789 Other viral agents as the cause of diseases classified elsewhere: Secondary | ICD-10-CM | POA: Insufficient documentation

## 2022-04-16 DIAGNOSIS — J339 Nasal polyp, unspecified: Secondary | ICD-10-CM | POA: Diagnosis not present

## 2022-04-16 DIAGNOSIS — J302 Other seasonal allergic rhinitis: Secondary | ICD-10-CM

## 2022-04-16 DIAGNOSIS — J028 Acute pharyngitis due to other specified organisms: Secondary | ICD-10-CM | POA: Diagnosis not present

## 2022-04-16 HISTORY — DX: Other viral agents as the cause of diseases classified elsewhere: B97.89

## 2022-04-16 MED ORDER — OLOPATADINE HCL 0.2 % OP SOLN: 1.0000 [drp] | Freq: Every day | OPHTHALMIC | 2 refills | Status: DC

## 2022-04-16 MED ORDER — FLUTICASONE PROPIONATE 50 MCG/ACT NA SUSP: 1.0000 | Freq: Every day | NASAL | 2 refills | Status: DC

## 2022-04-16 NOTE — Assessment & Plan Note (Signed)
Patient has R nasal polyp noted on exam. She was unaware. Discussed how allergy doctors may be able to prescribe medication to help shrink the polyp and how this could decrease some of her nasal symptoms in the future. ? ?Plan ?- Referral to allergy placed ?

## 2022-04-16 NOTE — Progress Notes (Signed)
? ?This is a Psychologist, occupational Note.  The care of the patient was discussed with Dr. Dolan Amen, MD and the assessment and plan was formulated with their assistance.  Please see their note for official documentation of the patient encounter.  ? ?Subjective:  ? ?Patient ID: Tiffiney Sparrow female   DOB: May 17, 1991 31 y.o.   MRN: [id number] ? ?HPI: ?Ms.Clancy Leiner is a 31 y.o. with PMHx of prediabetes and seasonal allergies who presents with sore throat. Please see problem-based charting for full assessment and plan. ? ?Past Medical History:  ?Diagnosis Date  ? Migraine   ? ?Current Outpatient Medications  ?Medication Sig Dispense Refill  ? fluticasone (FLONASE) 50 MCG/ACT nasal spray Place 1 spray into both nostrils daily. 9.9 mL 2  ? Olopatadine HCl 0.2 % SOLN Apply 1 drop to eye daily. 2.5 mL 2  ? clotrimazole (LOTRIMIN) 1 % cream Apply 1 application topically 2 (two) times daily. 30 g 0  ? fluconazole (DIFLUCAN) 150 MG tablet Take 1 tablet (150 mg total) by mouth every 3 (three) days. 2 tablet 0  ? metFORMIN (GLUCOPHAGE) 1000 MG tablet Take 1 tablet (1,000 mg total) by mouth daily with breakfast. 90 tablet 3  ? ?No current facility-administered medications for this visit.  ? ?No family history on file. ?Social History  ? ?Socioeconomic History  ? Marital status: Single  ?  Spouse name: Not on file  ? Number of children: Not on file  ? Years of education: Not on file  ? Highest education level: Not on file  ?Occupational History  ? Not on file  ?Tobacco Use  ? Smoking status: Never  ? Smokeless tobacco: Never  ?Substance and Sexual Activity  ? Alcohol use: No  ? Drug use: No  ? Sexual activity: Not on file  ?Other Topics Concern  ? Not on file  ?Social History Narrative  ? Not on file  ? ?Social Determinants of Health  ? ?Financial Resource Strain: Not on file  ?Food Insecurity: Not on file  ?Transportation Needs: Not on file  ?Physical Activity: Not on file  ?Stress: Not on file  ?Social Connections: Not on file   ? ?Review of Systems: ?Pertinent items are noted in HPI. ?Objective:  ?Physical Exam: ?Vitals:  ? 04/16/22 0952  ?BP: (!) 135/93  ?Pulse: 82  ?Temp: 98.2 ?F (36.8 ?C)  ?TempSrc: Oral  ?SpO2: 100%  ?Weight: 201 lb 1.6 oz (91.2 kg)  ?Height: 5\' 7"  (1.702 m)  ? ?BP (!) 135/93 (BP Location: Right Arm, Patient Position: Sitting, Cuff Size: Normal)   Pulse 82   Temp 98.2 ?F (36.8 ?C) (Oral)   Ht 5\' 7"  (1.702 m)   Wt 201 lb 1.6 oz (91.2 kg)   SpO2 100%   BMI 31.50 kg/m?  ? ?General Appearance:    Alert, cooperative, no distress  ?Head:    Normocephalic, atraumatic  ?Eyes:    Conjunctiva/corneas clear  ?Nose:   Pale, boggy-appearing nasal mucosa; right-sided nasal polyp, no bleeding  ?Throat:   Lips, mucosa, and tongue normal; teeth and gums normal, mild erythema in back of throat, no tonsillar swelling or exudates   ?Neck: ?Lungs:     Supple, symmetric, trachea midline ?  Respirations unlabored  ?Lymph nodes:   Tender R anterior cervical lymph node; Submandibular and supraclavicular lymph nodes normal  ? ?Assessment & Plan:  ? ?See encounters tab for problem-based charting. ? ?Polyp of right nasal cavity ?Patient has R nasal polyp noted on exam. She was  unaware. Discussed how allergy doctors may be able to prescribe medication to help shrink the polyp and how this could decrease some of her nasal symptoms in the future. ? ?Plan ?- Referral to allergy placed ? ?Sore throat (viral) ?Patient has had sore throat for the past 6 days. She has been having sore throat worse at night with night sweats and a mild headache. She has not checked to see if she has fever. The week prior, she had cold symptoms with runny nose, sneezing, and cough. Her sister had strep throat that required treatment with penicillin, and her mother also had acute viral pharyngitis. Patient has tried Tylenol and Mucinex with minimal relief. She says she feels "puffiness" of outside of her neck and sometimes has sensation that something is stuck in her  throat, but she denies shortness of breath, nausea, vomiting, changes in stool pattern.   ? ?On exam, she had evidence of erythema in back of her neck but no swollen tonsils or tonsillar exudate. She had pale, boggy-appearing nasal mucosa with no evidence of bleeding. She had a tender R anterior cervical lymph node with no other tenderness to palpation of neck. Her respirations were unlabored. ? ?This is most likely acute viral pharyngitis. Calculated her CENTOR score, and the score of 1 due to tender lymph node indicated 5-10% likelihood of having strep throat.For viral pharryngitis, recommended hydration and adding Ibuprofen to Tylenol. Gave patient return precautions including coughing up blood, high fever, difficulty breathing. ? ?Plan ?- Start ibuprofen scheduled ?- Hydration ? ?Seasonal allergies ?Patient has seasonal allergies and believes she has had significant difficulty this time of year. She endorses itchy eyes, runny nose, and sneezing. ? ?Plan ?- Start Flonase Nasal Spray ?- Start Olopatadine HCl eye drops ? ? ?

## 2022-04-16 NOTE — Progress Notes (Signed)
?  Attestation for Student Documentation: ? ?I personally was present and performed or re-performed the history, physical exam and medical decision-making activities of this service and have verified that the service and findings are accurately documented in the student?s note. ? ?Dolan Amen, MD ?04/16/2022, 12:31 PM  ?

## 2022-04-16 NOTE — Assessment & Plan Note (Signed)
Patient has seasonal allergies and believes she has had significant difficulty this time of year. She endorses itchy eyes, runny nose, and sneezing. ? ?Plan ?- Start Flonase Nasal Spray ?- Start Olopatadine HCl eye drops ?

## 2022-04-16 NOTE — Assessment & Plan Note (Addendum)
Patient has had sore throat for the past 6 days. She has been having sore throat worse at night with night sweats and a mild headache. She has not checked to see if she has fever. The week prior, she had cold symptoms with runny nose, sneezing, and cough. In the past month, her sister had strep throat requiring treatment with penicillin, and her mother also had acute viral pharyngitis. Patient has tried Tylenol and Mucinex with minimal relief. She says she feels "puffiness" of outside of her neck and sometimes has sensation that something is stuck in her throat, but she denies shortness of breath, nausea, vomiting, changes in stool pattern.   ? ?On exam, she had evidence of erythema in back of her neck but no swollen tonsils or tonsillar exudate. She had pale, boggy-appearing nasal mucosa with no evidence of bleeding. She had a tender R anterior cervical lymph node with no other tenderness to palpation of neck or appreciable swelling. Her respirations were unlabored. ? ?This is most likely acute viral pharyngitis. Calculated her CENTOR score, and the score of 1 due to tender lymph node indicated 5-10% likelihood of having strep throat.For viral pharryngitis, recommended hydration and adding Ibuprofen to Tylenol. Gave patient return precautions including coughing up blood, high fever, difficulty breathing. ? ?Plan ?- Start ibuprofen scheduled ?- Hydration ?

## 2022-04-16 NOTE — Patient Instructions (Signed)
Ms. Jo Harvey, ? ?It was very nice to see you in clinic today. Here are a few reminders from what we discussed: ? ?Your sore throat and cough are most likely viral pharyngitis. We recommend you continue taking tylenol, but you should also add ibuprofen. We also recommend you drink plenty of fluids to remain hydrated! ?For your nasal inflammation and eye irritation, we will prescribe Flonase Nasal Spray and eye drops. Please use these daily. ?For the right-sided nasal polyp, we will refer you to see an allergist. They may prescribe a medication to help shrink the polyp. ?If your symptoms worsen, please contact us so that we can see you again in clinic. ? ?If you have any questions or concerns, please do not hesitate to give [place] a call! ?

## 2022-04-19 NOTE — Progress Notes (Signed)
Internal Medicine Clinic Attending ? ?Case discussed with Dr. Winters  At the time of the visit.  We reviewed the resident?s history and exam and pertinent patient test results.  I agree with the assessment, diagnosis, and plan of care documented in the resident?s note.  ?

## 2022-05-12 NOTE — Progress Notes (Unsigned)
New Patient Note  RE: Jo Harvey MRN: [id number] DOB: [birth year] Date of Office Visit: 05/13/2022  Consult requested by: Tyson Alias Primary care provider: Doran Stabler, DO  Chief Complaint: No chief complaint on file.  History of Present Illness: I had the pleasure of seeing Jo Harvey for initial evaluation at the [facility] on 05/12/2022. She is a 31 y.o. female, who is referred here by Doran Stabler, DO for the evaluation of nasal polyp.  She reports symptoms of ***. Symptoms have been going on for *** years. The symptoms are present *** all year around with worsening in ***. Other triggers include exposure to ***. Anosmia: ***. Headache: ***. She has used *** with ***fair improvement in symptoms. Sinus infections: ***. Previous work up includes: ***. Previous ENT evaluation: ***. Previous sinus imaging: ***. History of nasal polyps: ***. Last eye exam: ***. History of reflux: ***.  Assessment and Plan: Jo Harvey is a 31 y.o. female with: No problem-specific Assessment & Plan notes found for this encounter.  No follow-ups on file.  No orders of the defined types were placed in this encounter.  Lab Orders  No laboratory test(s) ordered today    Other allergy screening: Asthma: {Blank single:19197::"yes","no"} Rhino conjunctivitis: {Blank single:19197::"yes","no"} Food allergy: {Blank single:19197::"yes","no"} Medication allergy: {Blank single:19197::"yes","no"} Hymenoptera allergy: {Blank single:19197::"yes","no"} Urticaria: {Blank single:19197::"yes","no"} Eczema:{Blank single:19197::"yes","no"} History of recurrent infections suggestive of immunodeficency: {Blank single:19197::"yes","no"}  Diagnostics: Spirometry:  Tracings reviewed. Her effort: {Blank single:19197::"Good reproducible efforts.","It was hard to get consistent efforts and there is a question as to whether this reflects a maximal maneuver.","Poor effort, data can not be  interpreted."} FVC: ***L FEV1: ***L, ***% predicted FEV1/FVC ratio: ***% Interpretation: {Blank single:19197::"Spirometry consistent with mild obstructive disease","Spirometry consistent with moderate obstructive disease","Spirometry consistent with severe obstructive disease","Spirometry consistent with possible restrictive disease","Spirometry consistent with mixed obstructive and restrictive disease","Spirometry uninterpretable due to technique","Spirometry consistent with normal pattern","No overt abnormalities noted given today's efforts"}.  Please see scanned spirometry results for details.  Skin Testing: {Blank single:19197::"Select foods","Environmental allergy panel","Environmental allergy panel and select foods","Food allergy panel","None","Deferred due to recent antihistamines use"}. *** Results discussed with patient/family.   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 Medication List:  Current Outpatient Medications  Medication Sig Dispense Refill   clotrimazole (LOTRIMIN) 1 % cream Apply 1 application topically 2 (two) times daily. 30 g 0   fluconazole (DIFLUCAN) 150 MG tablet Take 1 tablet (150 mg total) by mouth every 3 (three) days. 2 tablet 0   fluticasone (FLONASE) 50 MCG/ACT nasal spray Place 1 spray into both nostrils daily. 9.9 mL 2   metFORMIN (GLUCOPHAGE) 1000 MG tablet Take 1 tablet (1,000 mg total) by mouth daily with breakfast. 90 tablet 3   Olopatadine HCl 0.2 %  SOLN Apply 1 drop to eye daily. 2.5 mL 2   No current facility-administered medications for this visit.   Allergies: Allergies  Allergen Reactions   Pork-Derived Products Itching   Social History: Social History  Socioeconomic History   Marital status: Single    Spouse name: Not on file   Number of children: Not on file   Years of education: Not on file   Highest education level: Not on file  Occupational History   Not on file  Tobacco Use   Smoking status: Never   Smokeless tobacco: Never  Substance and Sexual Activity   Alcohol use: No   Drug use: No   Sexual activity: Not on file  Other Topics Concern   Not on file  Social History Narrative   Not on file   Social Determinants of Health   Financial Resource Strain: Not on file  Food Insecurity: Not on file  Transportation Needs: Not on file  Physical Activity: Not on file  Stress: Not on file  Social Connections: Not on file   Lives in a ***. Smoking: *** Occupation: ***  Environmental HistorySurveyor, minerals: Water Damage/mildew in the house: Copywriter, advertising{Blank single:19197::"yes","no"} Carpet in the family room: {Blank single:19197::"yes","no"} Carpet in the bedroom: {Blank single:19197::"yes","no"} Heating: {Blank single:19197::"electric","gas","heat pump"} Cooling: {Blank single:19197::"central","window","heat pump"} Pet: {Blank single:19197::"yes ***","no"}  Family History: No family history on file. Problem                               Relation Asthma                                   *** Eczema                                *** Food allergy                          *** Allergic rhino conjunctivitis     ***  Review of Systems  Constitutional:  Negative for appetite change, chills, fever and unexpected weight change.  HENT:  Negative for congestion and rhinorrhea.   Eyes:  Negative for itching.  Respiratory:  Negative for cough, chest tightness, shortness of breath and wheezing.   Cardiovascular:  Negative for chest  pain.  Gastrointestinal:  Negative for abdominal pain.  Genitourinary:  Negative for difficulty urinating.  Skin:  Negative for rash.  Neurological:  Negative for headaches.   Objective: There were no vitals taken for this visit. There is no height or weight on file to calculate BMI. Physical Exam Vitals and nursing note reviewed.  Constitutional:      Appearance: Normal appearance. She is well-developed.  HENT:     Head: Normocephalic and atraumatic.     Right Ear: Tympanic membrane and external ear normal.     Left Ear: Tympanic membrane and external ear normal.     Nose: Nose normal.     Mouth/Throat:     Mouth: Mucous membranes are moist.     Pharynx: Oropharynx is clear.  Eyes:     Conjunctiva/sclera: Conjunctivae normal.  Cardiovascular:     Rate and Rhythm: Normal rate and regular rhythm.     Heart sounds: Normal heart sounds. No murmur heard.   No friction rub. No gallop.  Pulmonary:     Effort: Pulmonary effort is normal.     Breath sounds: Normal breath sounds. No wheezing, rhonchi or rales.  Musculoskeletal:     Cervical back: Neck supple.  Skin:  General: Skin is warm.     Findings: No rash.  Neurological:     Mental Status: She is alert and oriented to person, place, and time.  Psychiatric:        Behavior: Behavior normal.   The plan was reviewed with the patient/family, and all questions/concerned were addressed.  It was my pleasure to see Elisea today and participate in her care. Please feel free to contact me with any questions or concerns.  Sincerely,  Wyline Mood, DO Allergy & Immunology  [facility] office: [phone] [facility] office: [phone]

## 2022-05-13 ENCOUNTER — Encounter: Payer: Self-pay | Admitting: Allergy

## 2022-05-13 ENCOUNTER — Ambulatory Visit (INDEPENDENT_AMBULATORY_CARE_PROVIDER_SITE_OTHER): Payer: 59 | Admitting: Allergy

## 2022-05-13 VITALS — BP 130/78 | HR 67 | Temp 98.2°F | Resp 16 | Ht 68.0 in | Wt 200.0 lb

## 2022-05-13 DIAGNOSIS — J339 Nasal polyp, unspecified: Secondary | ICD-10-CM | POA: Diagnosis not present

## 2022-05-13 DIAGNOSIS — H1013 Acute atopic conjunctivitis, bilateral: Secondary | ICD-10-CM

## 2022-05-13 DIAGNOSIS — J3089 Other allergic rhinitis: Secondary | ICD-10-CM | POA: Insufficient documentation

## 2022-05-13 NOTE — Patient Instructions (Signed)
Nasal symptoms. I did not see any polyps today which is great. Use Flonase (fluticasone) nasal spray 1 spray per nostril 1-2 times a day as needed for nasal congestion.  You may try to stop and see if your symptoms return. Consider allergy testing in the future to see if you have any environmental allergies contributing to your symptoms. Also consider ENT evaluation in the future.  Follow up in 3 months or sooner if needed. If you want to do allergy testing - make sure you don't take any allergy medications for 3 days before the visit.

## 2022-05-13 NOTE — Assessment & Plan Note (Addendum)
Rhinoconjunctivitis symptoms mainly in the spring.  Tried Zyrtec with no benefit.  PCP recently found polyp on the right side and started on Flonase with good benefit.  No prior allergy/ENT evaluation. . Patient declined environmental allergy testing today as she is already feeling better. . No polyp noted on today's exam.  . Use Flonase (fluticasone) nasal spray 1 spray per nostril 1-2 times a day as needed for nasal congestion.  o You may try to stop and see if your symptoms return. . Recommend environmental allergy testing in future if symptoms worsen.  . Also consider ENT evaluation. . Discussed the use of Dupixent briefly for persistent nasal polyps.

## 2022-05-13 NOTE — Assessment & Plan Note (Signed)
No polyp noted on exam today. . See assessment and plan as above.

## 2022-05-13 NOTE — Assessment & Plan Note (Signed)
.   Use olopatadine eye drops 0.2% once a day as needed for itchy/watery eyes.

## 2022-05-31 ENCOUNTER — Telehealth: Payer: Self-pay | Admitting: *Deleted

## 2022-05-31 ENCOUNTER — Encounter: Payer: 59 | Admitting: Student

## 2022-05-31 NOTE — Telephone Encounter (Signed)
Called patient regarding her missed appointment. Patient is to call to reschedule this appointment. She thought her appointment was for tomorrow.

## 2022-06-01 ENCOUNTER — Other Ambulatory Visit: Payer: Self-pay

## 2022-06-01 ENCOUNTER — Encounter: Payer: Self-pay | Admitting: Student

## 2022-06-01 ENCOUNTER — Telehealth: Payer: Self-pay

## 2022-06-01 ENCOUNTER — Ambulatory Visit (INDEPENDENT_AMBULATORY_CARE_PROVIDER_SITE_OTHER): Payer: 59 | Admitting: Student

## 2022-06-01 VITALS — BP 115/74 | HR 94 | Temp 98.6°F | Resp 28 | Ht 68.0 in | Wt 197.7 lb

## 2022-06-01 DIAGNOSIS — Z713 Dietary counseling and surveillance: Secondary | ICD-10-CM | POA: Diagnosis not present

## 2022-06-01 DIAGNOSIS — R7303 Prediabetes: Secondary | ICD-10-CM

## 2022-06-01 DIAGNOSIS — E6609 Other obesity due to excess calories: Secondary | ICD-10-CM

## 2022-06-01 DIAGNOSIS — Z6830 Body mass index (BMI) 30.0-30.9, adult: Secondary | ICD-10-CM | POA: Diagnosis not present

## 2022-06-01 LAB — POCT GLYCOSYLATED HEMOGLOBIN (HGB A1C): Hemoglobin A1C: 6.1 % — AB (ref 4.0–5.6)

## 2022-06-01 LAB — GLUCOSE, CAPILLARY: Glucose-Capillary: 76 mg/dL (ref 70–99)

## 2022-06-01 MED ORDER — METFORMIN HCL 1000 MG PO TABS: 1000.0000 mg | ORAL_TABLET | Freq: Every day | ORAL | 3 refills | Status: DC

## 2022-06-01 NOTE — Telephone Encounter (Signed)
Mrs Lanora Manis  from [place]  is requesting the Last A1c of patient taken yesterday  to be faxed to [phone]

## 2022-06-01 NOTE — Assessment & Plan Note (Addendum)
Patient with history of pre-diabetes, last A1c of 6% in 11/2021. She has been taking metformin 1000mg  daily since that time and has incorporated lifestyle modifications (moderate intensity exercise 4-5 times/week along with healthier diet low in fats and carbs). Repeat A1c today at 6.1%, remains in pre-diabetic range.  Plan: -continue metformin (refilled today) and lifestyle modifications -next A1c in 6 months

## 2022-06-01 NOTE — Assessment & Plan Note (Signed)
Patient wished to know options for weight loss. She has been struggling with losing weight despite healthier diet (low in fats and carbs) along with moderate intensity exercise. She was about 210lbs in 11/2021 and has come down to 197lbs today. She feels like her weight is stagnating. We discussed different weight loss options, but she does not wish to use an injection medicine. Our only non-injectable weight loss option would be Rybelsus, but she does not have a diagnosis of diabetes for this to be covered. She is amenable to continuing current lifestyle modifications and I have placed a referral to Lup[facility] Leash to help with nutrition for weight loss.   Plan: -continue lifestyle modifications -referral to Lupita Leash for nutrition

## 2022-06-01 NOTE — Telephone Encounter (Signed)
Talked to [place] at [facility] who stated pt is having a procedure (lipo) tomorrow. State surg clearance form was fax to our office but they only need AIC result in which the pt stated was done yesterday. Informed pt did not have a appt yesterday and last AIC was 09/2021. Lanora Manis stated she will call the pt to see if it was done at another office.

## 2022-06-01 NOTE — Progress Notes (Signed)
   CC: f/u prediabetes, weight loss management  HPI:  Jo Harvey is a 31 y.o. female with history listed below presenting to the [facility] for f/u prediabetes and weight loss management. Please see individualized problem based charting for full HPI.  Past Medical History:  Diagnosis Date   Migraine     Review of Systems:  Negative aside from that listed in individualized problem based charting.  Physical Exam:  Vitals:   06/01/22 1553  BP: 115/74  Pulse: 94  Resp: (!) 28  Temp: 98.6 F (37 C)  TempSrc: Oral  SpO2: 100%  Weight: 197 lb 11.2 oz (89.7 kg)  Height: 5\' 8"  (1.727 m)   Physical Exam Constitutional:      Appearance: She is obese. She is not ill-appearing.  HENT:     Mouth/Throat:     Mouth: Mucous membranes are moist.     Pharynx: Oropharynx is clear.  Eyes:     Extraocular Movements: Extraocular movements intact.     Conjunctiva/sclera: Conjunctivae normal.     Pupils: Pupils are equal, round, and reactive to light.  Cardiovascular:     Rate and Rhythm: Normal rate and regular rhythm.     Pulses: Normal pulses.     Heart sounds: Normal heart sounds. No murmur heard.    No gallop.  Abdominal:     General: Bowel sounds are normal. There is no distension.     Palpations: Abdomen is soft.     Tenderness: There is no abdominal tenderness.  Musculoskeletal:        General: No swelling. Normal range of motion.  Skin:    General: Skin is warm and dry.  Neurological:     General: No focal deficit present.     Mental Status: She is alert and oriented to person, place, and time.  Psychiatric:        Mood and Affect: Mood normal.        Behavior: Behavior normal.      Assessment & Plan:   See Encounters Tab for problem based charting.  Patient discussed with Dr. 

## 2022-06-01 NOTE — Patient Instructions (Addendum)
Ms. Truby,  It was a pleasure seeing you in the clinic today.   We are checking your A1c today. I will call you with the results or you can access them through MyChart. I have placed a referral to our nutrition counselor. She will give you a call. Please come back in 2 weeks for your PAP smear.  Please call our clinic at [phone] if you have any questions or concerns. The best time to call is Monday-Friday from 9am-4pm, but there is someone available 24/7 at the same number. If you need medication refills, please notify your pharmacy one week in advance and they will send [place] a request.   Thank you for letting us take part in your care. We look forward to seeing you next time!

## 2022-08-17 ENCOUNTER — Ambulatory Visit: Payer: 59 | Admitting: Allergy

## 2023-05-09 ENCOUNTER — Encounter: Payer: Self-pay | Admitting: *Deleted

## 2023-06-14 ENCOUNTER — Ambulatory Visit
Admission: EM | Admit: 2023-06-14 | Discharge: 2023-06-14 | Disposition: A | Discharge status: Home / Self Care | Payer: 59

## 2023-06-14 DIAGNOSIS — H6991 Unspecified Eustachian tube disorder, right ear: Secondary | ICD-10-CM

## 2023-06-14 DIAGNOSIS — H65191 Other acute nonsuppurative otitis media, right ear: Secondary | ICD-10-CM | POA: Diagnosis not present

## 2023-06-14 MED ORDER — PSEUDOEPHEDRINE HCL 60 MG PO TABS: 60.0000 mg | ORAL_TABLET | Freq: Three times a day (TID) | ORAL | 0 refills | Status: DC | PRN

## 2023-06-14 MED ORDER — FEXOFENADINE HCL 180 MG PO TABS: 180.0000 mg | ORAL_TABLET | Freq: Every day | ORAL | 0 refills | Status: DC

## 2023-06-14 MED ORDER — CEFDINIR 300 MG PO CAPS: 300.0000 mg | ORAL_CAPSULE | Freq: Two times a day (BID) | ORAL | 0 refills | Status: DC

## 2023-06-14 MED ORDER — FLUTICASONE PROPIONATE 50 MCG/ACT NA SUSP: 2.0000 | Freq: Every day | NASAL | 1 refills | Status: DC

## 2023-06-14 NOTE — ED Triage Notes (Signed)
Pt reports ringing in right ear x 2 week; pain in right ear x 1 week.

## 2023-06-14 NOTE — Discharge Instructions (Addendum)
Start taking Allegra and Flonase daily long term to control the eustachian tube dysfunction. Use Sudafed the rest of this week and then as needed. Take cefdinir for a secondary ear infection.

## 2023-06-14 NOTE — ED Provider Notes (Signed)
[facility]  Note:  This document was prepared using Conservation officer, historic buildings and may include unintentional dictation errors.  MRN: [id number] DOB: October 02, 1991  Subjective:   Jo Harvey is a 32 y.o. female presenting for 2-week history of persistent ringing and fullness of the right ear.  In the past week has had more persistent pain.  Has a history of allergies and used to take Zyrtec but had to stop this due to drowsiness.  No drainage, dizziness, fever.  No current facility-administered medications for this encounter.  Current Outpatient Medications:    Multiple Vitamin (MULTIVITAMIN ADULT PO), Take by mouth., Disp: , Rfl:    fluticasone (FLONASE) 50 MCG/ACT nasal spray, Place 1 spray into both nostrils daily., Disp: 9.9 mL, Rfl: 2   metFORMIN (GLUCOPHAGE) 1000 MG tablet, Take 1 tablet (1,000 mg total) by mouth daily with breakfast., Disp: 90 tablet, Rfl: 3   Olopatadine HCl 0.2 % SOLN, Apply 1 drop to eye daily., Disp: 2.5 mL, Rfl: 2   Allergies  Allergen Reactions   Pork-Derived Products Itching    Past Medical History:  Diagnosis Date   Hematuria, microscopic 02/28/2018   Migraine    Sore throat (viral) 04/16/2022     History reviewed. No pertinent surgical history.  Family History  Problem Relation Age of Onset   Allergic rhinitis Brother    Allergic rhinitis Maternal Grandmother     Social History   Tobacco Use   Smoking status: Never    Passive exposure: Never   Smokeless tobacco: Never  Vaping Use   Vaping Use: Never used  Substance Use Topics   Alcohol use: No   Drug use: Never    ROS   Objective:   Vitals: BP 115/80 (BP Location: Right Arm)   Pulse 79   Temp 97.9 F (36.6 C) (Oral)   Resp 14   LMP 05/27/2023 (Exact Date)   SpO2 98%   Physical Exam Constitutional:      General: She is not in acute distress.    Appearance: Normal appearance. She is well-developed. She is not ill-appearing, toxic-appearing or  diaphoretic.  HENT:     Head: Normocephalic and atraumatic.     Right Ear: Ear canal and external ear normal. No tenderness. A middle ear effusion is present. There is no impacted cerumen. Tympanic membrane is not injected, perforated, erythematous or bulging.     Left Ear: Tympanic membrane, ear canal and external ear normal. No tenderness.  No middle ear effusion. There is no impacted cerumen. Tympanic membrane is not injected, perforated, erythematous or bulging.     Nose: Nose normal.     Mouth/Throat:     Mouth: Mucous membranes are moist.  Eyes:     General: No scleral icterus.       Right eye: No discharge.        Left eye: No discharge.     Extraocular Movements: Extraocular movements intact.  Cardiovascular:     Rate and Rhythm: Normal rate.  Pulmonary:     Effort: Pulmonary effort is normal.  Skin:    General: Skin is warm and dry.  Neurological:     General: No focal deficit present.     Mental Status: She is alert and oriented to person, place, and time.  Psychiatric:        Mood and Affect: Mood normal.        Behavior: Behavior normal.     Assessment and Plan :  PDMP not reviewed this encounter.  1. Eustachian tube dysfunction, right   2. Other non-recurrent acute nonsuppurative otitis media of right ear    Will use conservative management for what I suspect is eustachian tube dysfunction.  Recommended starting Flonase, Allegra, Sudafed.  Recommend addressing secondary otitis media with cefdinir.  Follow-up with ENT for persistent symptoms.  Counseled patient on potential for adverse effects with medications prescribed/recommended today, ER and return-to-clinic precautions discussed, patient verbalized understanding.    Wallis Bamberg, [place] 06/14/23 1008

## 2023-07-26 ENCOUNTER — Encounter: Payer: Self-pay | Admitting: Nurse Practitioner

## 2023-07-26 ENCOUNTER — Ambulatory Visit: Payer: 59 | Admitting: Nurse Practitioner

## 2023-07-26 VITALS — BP 113/78 | HR 81 | Resp 16 | Ht 67.0 in | Wt 196.0 lb

## 2023-07-26 DIAGNOSIS — Z23 Encounter for immunization: Secondary | ICD-10-CM | POA: Diagnosis not present

## 2023-07-26 DIAGNOSIS — Z1329 Encounter for screening for other suspected endocrine disorder: Secondary | ICD-10-CM | POA: Diagnosis not present

## 2023-07-26 DIAGNOSIS — E669 Obesity, unspecified: Secondary | ICD-10-CM | POA: Insufficient documentation

## 2023-07-26 DIAGNOSIS — Z130 Encounter for screening for diseases of the blood and blood-forming organs and certain disorders involving the immune mechanism: Secondary | ICD-10-CM

## 2023-07-26 DIAGNOSIS — Z1321 Encounter for screening for nutritional disorder: Secondary | ICD-10-CM

## 2023-07-26 DIAGNOSIS — R7303 Prediabetes: Secondary | ICD-10-CM

## 2023-07-26 DIAGNOSIS — Z0000 Encounter for general adult medical examination without abnormal findings: Secondary | ICD-10-CM | POA: Diagnosis not present

## 2023-07-26 DIAGNOSIS — Z124 Encounter for screening for malignant neoplasm of cervix: Secondary | ICD-10-CM | POA: Insufficient documentation

## 2023-07-26 DIAGNOSIS — Z13228 Encounter for screening for other metabolic disorders: Secondary | ICD-10-CM

## 2023-07-26 LAB — POCT GLYCOSYLATED HEMOGLOBIN (HGB A1C): HbA1c, POC (prediabetic range): 6 % (ref 5.7–6.4)

## 2023-07-26 MED ORDER — METFORMIN HCL 1000 MG PO TABS
1000.0000 mg | ORAL_TABLET | Freq: Every day | ORAL | 3 refills | Status: DC
Start: 2023-07-26 — End: 2024-10-05

## 2023-07-26 NOTE — Assessment & Plan Note (Signed)
Lab Results  Component Value Date   HGBA1C 6.0 07/26/2023  Continue metformin 1000 mg daily Patient counseled on low-carb modified diet Need for regular moderate exercises at least 250 minutes weekly discussed

## 2023-07-26 NOTE — Progress Notes (Signed)
Pt presents to establish care  -request A1c and cholesterol be checked

## 2023-07-26 NOTE — Assessment & Plan Note (Addendum)
Wt Readings from Last 3 Encounters:  07/26/23 196 lb (88.9 kg)  06/01/22 197 lb 11.2 oz (89.7 kg)  05/13/22 200 lb (90.7 kg)   Body mass index is 30.7 kg/m.  Patient counseled on low-carb modified diet Encouraged to engage in regular moderate to vigorous exercise of at least 150 minutes weekly Benefits of healthy weights discussed

## 2023-07-26 NOTE — Assessment & Plan Note (Signed)
Annual exam as documented.  Counseling done include healthy lifestyle involving committing to 150 minutes of exercise per week, heart healthy diet, and attaining healthy weight. The importance of adequate sleep also discussed.  Regular use of seat belt and home safety were also discussed . Changes in health habits are decided on by patient with goals and time frames set for achieving them. Immunization and cancer screening  needs are specifically addressed at this visit.    Pap smear completed, Tdap vaccine given, routine fasting labs ordered

## 2023-07-26 NOTE — Patient Instructions (Signed)
1. Pre-diabetes  - POCT glycosylated hemoglobin (Hb A1C)  2. Annual physical exam   3. Screening for endocrine, nutritional, metabolic and immunity disorder  - CBC with Differential/Platelet; Future - CMP14+EGFR; Future - Lipid panel; Future - TSH; Future - Hepatitis C antibody; Future  4. Need for diphtheria-tetanus-pertussis (Tdap) vaccine   5. Screening for cervical cancer  - Ambulatory referral to Gynecology  6. Obesity (BMI 30-39.9)  It is important that you exercise regularly at least 30 minutes 5 times a week as tolerated  Think about what you will eat, plan ahead. Choose " clean, green, fresh or frozen" over canned, processed or packaged foods which are more sugary, salty and fatty. 70 to 75% of food eaten should be vegetables and fruit. Three meals at set times with snacks allowed between meals, but they must be fruit or vegetables. Aim to eat over a 12 hour period , example 7 am to 7 pm, and STOP after  your last meal of the day. Drink water,generally about 64 ounces per day, no other drink is as healthy. Fruit juice is best enjoyed in a healthy way, by EATING the fruit.  Thanks for choosing [facility] we consider it a privelige to serve you.

## 2023-07-26 NOTE — Progress Notes (Signed)
New Patient Office Visit  Subjective:  Patient ID: Jo Harvey, female    DOB: 1991-04-13  Age: 32 y.o. MRN: [id number]  CC:  Chief Complaint  Patient presents with   Establish Care    HPI Jo Harvey is a 32 y.o. female  has a past medical history of Hematuria, microscopic (02/28/2018), Migraine, Prediabetes, and Sore throat (viral) (04/16/2022).  Patient presents to establish care for her chronic medical conditions and for annual physical examination.  Previous PCP Doran Stabler MD.  she is on a general diet does walking exercises 45 minutes every other day, she generally feels well.  Due for Pap smear, patient unable to tolerate procedure today referral sent to OB/GYN Due for Tdap vaccine Tdap vaccine administered in the office today She wears prescription glasses, stated that she has upcoming eye exam.   Past Medical History:  Diagnosis Date   Hematuria, microscopic 02/28/2018   Migraine    Prediabetes    Sore throat (viral) 04/16/2022    History reviewed. No pertinent surgical history.  Family History  Problem Relation Age of Onset   Diabetes Mother    High blood pressure Mother    Diabetes Father    Allergic rhinitis Brother    Allergic rhinitis Maternal Grandmother    Breast cancer Neg Hx    Cervical cancer Neg Hx     Social History   Socioeconomic History   Marital status: Single    Spouse name: Not on file   Number of children: Not on file   Years of education: Not on file   Highest education level: Not on file  Occupational History   Not on file  Tobacco Use   Smoking status: Never    Passive exposure: Never   Smokeless tobacco: Never  Vaping Use   Vaping status: Never Used  Substance and Sexual Activity   Alcohol use: No   Drug use: Never   Sexual activity: Not Currently  Other Topics Concern   Not on file  Social History Narrative   Lives with her family    Social Determinants of Health   Financial Resource Strain: Not on file  Food  Insecurity: Not on file  Transportation Needs: Not on file  Physical Activity: Not on file  Stress: Not on file  Social Connections: Not on file  Intimate Partner Violence: Not on file    ROS Review of Systems  Constitutional:  Negative for activity change, appetite change, chills, fatigue and fever.  HENT:  Negative for congestion, dental problem, ear discharge, ear pain, hearing loss, rhinorrhea, sinus pressure, sinus pain, sneezing and sore throat.   Eyes:  Negative for pain, discharge, redness and itching.  Respiratory:  Negative for cough, chest tightness, shortness of breath and wheezing.   Cardiovascular:  Negative for chest pain, palpitations and leg swelling.  Gastrointestinal:  Negative for abdominal distention, abdominal pain, anal bleeding, blood in stool, constipation, diarrhea, nausea, rectal pain and vomiting.  Endocrine: Negative for cold intolerance, heat intolerance, polydipsia, polyphagia and polyuria.  Genitourinary:  Negative for difficulty urinating, dysuria, flank pain, frequency, hematuria, menstrual problem, pelvic pain and vaginal bleeding.  Musculoskeletal:  Negative for arthralgias, back pain, gait problem, joint swelling and myalgias.  Skin:  Negative for color change, pallor, rash and wound.  Allergic/Immunologic: Negative for environmental allergies, food allergies and immunocompromised state.  Neurological:  Negative for dizziness, tremors, facial asymmetry, weakness and headaches.  Hematological:  Negative for adenopathy. Does not bruise/bleed easily.  Psychiatric/Behavioral:  Negative for  agitation, behavioral problems, confusion, decreased concentration, hallucinations, self-injury and suicidal ideas.     Objective:   Today's Vitals: BP 113/78 (BP Location: Left Arm, Patient Position: Sitting, Cuff Size: Large)   Pulse 81   Resp 16   Ht 5\' 7"  (1.702 m)   Wt 196 lb (88.9 kg)   SpO2 100%   BMI 30.70 kg/m   Physical Exam Vitals and nursing note  reviewed. Exam conducted with a chaperone present.  Constitutional:      General: She is not in acute distress.    Appearance: Normal appearance. She is obese. She is not ill-appearing, toxic-appearing or diaphoretic.  HENT:     Right Ear: Tympanic membrane, ear canal and external ear normal. There is no impacted cerumen.     Left Ear: Tympanic membrane, ear canal and external ear normal. There is no impacted cerumen.     Nose: Nose normal. No congestion or rhinorrhea.     Mouth/Throat:     Mouth: Mucous membranes are moist.     Pharynx: Oropharynx is clear. No oropharyngeal exudate or posterior oropharyngeal erythema.  Eyes:     General: No scleral icterus.       Right eye: No discharge.        Left eye: No discharge.     Extraocular Movements: Extraocular movements intact.     Conjunctiva/sclera: Conjunctivae normal.  Neck:     Vascular: No carotid bruit.  Cardiovascular:     Rate and Rhythm: Normal rate and regular rhythm.     Pulses: Normal pulses.     Heart sounds: Normal heart sounds. No murmur heard.    No friction rub. No gallop.  Pulmonary:     Effort: Pulmonary effort is normal. No respiratory distress.     Breath sounds: Normal breath sounds. No stridor. No wheezing, rhonchi or rales.  Chest:     Chest wall: No mass, lacerations, deformity, swelling, tenderness or edema.  Breasts:    Tanner Score is 5.     Breasts are symmetrical.     Right: Normal. No swelling, bleeding, inverted nipple, mass, nipple discharge, skin change or tenderness.     Left: Normal. No swelling, bleeding, inverted nipple, mass, nipple discharge, skin change or tenderness.  Abdominal:     General: Bowel sounds are normal. There is no distension.     Palpations: Abdomen is soft. There is no mass.     Tenderness: There is no abdominal tenderness. There is no right CVA tenderness, left CVA tenderness, guarding or rebound.     Hernia: No hernia is present. There is no hernia in the left inguinal  area or right inguinal area.  Genitourinary:    General: Normal vulva.     Exam position: Lithotomy position.     Pubic Area: No rash or pubic lice.      Tanner stage (genital): 5.     Labia:        Right: No rash, tenderness, lesion or injury.        Left: No rash, tenderness, lesion or injury.      Urethra: No prolapse, urethral pain, urethral swelling or urethral lesion.     Vagina: No signs of injury and foreign body. No vaginal discharge, erythema, tenderness, bleeding, lesions or prolapsed vaginal walls.     Uterus: Normal.      Comments: Unable to tolerate the procedure , patient referred to OBGYN Musculoskeletal:        General: No swelling, tenderness, deformity or  signs of injury.     Cervical back: Normal range of motion and neck supple. No rigidity or tenderness.     Right lower leg: No edema.     Left lower leg: No edema.  Lymphadenopathy:     Cervical: No cervical adenopathy.     Upper Body:     Right upper body: No supraclavicular, axillary or pectoral adenopathy.     Left upper body: No supraclavicular, axillary or pectoral adenopathy.     Lower Body: No right inguinal adenopathy. No left inguinal adenopathy.  Skin:    General: Skin is warm and dry.     Capillary Refill: Capillary refill takes less than 2 seconds.     Coloration: Skin is not jaundiced or pale.     Findings: No bruising, erythema, lesion or rash.  Neurological:     Mental Status: She is alert and oriented to person, place, and time.     Cranial Nerves: No cranial nerve deficit.     Sensory: No sensory deficit.     Motor: No weakness.     Coordination: Coordination normal.     Gait: Gait normal.     Deep Tendon Reflexes: Reflexes normal.  Psychiatric:        Mood and Affect: Mood normal.        Behavior: Behavior normal.        Thought Content: Thought content normal.        Judgment: Judgment normal.     Assessment & Plan:   Problem List Items Addressed This Visit       Other    Pre-diabetes    Lab Results  Component Value Date   HGBA1C 6.0 07/26/2023  Continue metformin 1000 mg daily Patient counseled on low-carb modified diet Need for regular moderate exercises at least 250 minutes weekly discussed      Relevant Medications   metFORMIN (GLUCOPHAGE) 1000 MG tablet   Other Relevant Orders   POCT glycosylated hemoglobin (Hb A1C) (Completed)   Need for diphtheria-tetanus-pertussis (Tdap) vaccine    Patient educated on CDC recommendation for the TDAP  vaccine. Verbal consent was obtained from the patient, vaccine administered by nurse, no sign of adverse reactions noted at this time. Patient education on arm soreness and use of tylenol  or this patient  was discussed. Patient educated on the signs and symptoms of adverse effect and advise to contact the office if they occur.       Screening for cervical cancer   Relevant Orders   Ambulatory referral to Gynecology   Annual physical exam - Primary    Annual exam as documented.  Counseling done include healthy lifestyle involving committing to 150 minutes of exercise per week, heart healthy diet, and attaining healthy weight. The importance of adequate sleep also discussed.  Regular use of seat belt and home safety were also discussed . Changes in health habits are decided on by patient with goals and time frames set for achieving them. Immunization and cancer screening  needs are specifically addressed at this visit.    Pap smear completed, Tdap vaccine given, routine fasting labs ordered      Obesity (BMI 30-39.9)    Wt Readings from Last 3 Encounters:  07/26/23 196 lb (88.9 kg)  06/01/22 197 lb 11.2 oz (89.7 kg)  05/13/22 200 lb (90.7 kg)   Body mass index is 30.7 kg/m.  Patient counseled on low-carb modified diet Encouraged to engage in regular moderate to vigorous exercise of at least  150 minutes weekly Benefits of healthy weights discussed      Relevant Medications   metFORMIN (GLUCOPHAGE) 1000 MG  tablet   Other Visit Diagnoses     Screening for endocrine, nutritional, metabolic and immunity disorder       Relevant Orders   CBC with Differential/Platelet   CMP14+EGFR   Lipid panel   TSH   Hepatitis C antibody       Outpatient Encounter Medications as of 07/26/2023  Medication Sig   fexofenadine (ALLEGRA) 180 MG tablet Take 1 tablet (180 mg total) by mouth daily.   fluticasone (FLONASE) 50 MCG/ACT nasal spray Place 2 sprays into both nostrils daily.   Multiple Vitamin (MULTIVITAMIN ADULT PO) Take by mouth.   [DISCONTINUED] metFORMIN (GLUCOPHAGE) 1000 MG tablet Take 1 tablet (1,000 mg total) by mouth daily with breakfast.   metFORMIN (GLUCOPHAGE) 1000 MG tablet Take 1 tablet (1,000 mg total) by mouth daily with breakfast.   [DISCONTINUED] cefdinir (OMNICEF) 300 MG capsule Take 1 capsule (300 mg total) by mouth 2 (two) times daily.   [DISCONTINUED] Olopatadine HCl 0.2 % SOLN Apply 1 drop to eye daily.   [DISCONTINUED] pseudoephedrine (SUDAFED) 60 MG tablet Take 1 tablet (60 mg total) by mouth every 8 (eight) hours as needed for congestion.   No facility-administered encounter medications on file as of 07/26/2023.    Follow-up: Return in about 1 year (around 07/25/2024) for CPE, FASTING LABS THIS WEEK.   Donell Beers, FNP

## 2023-07-26 NOTE — Assessment & Plan Note (Signed)
Patient educated on CDC recommendation for the  TDAP vaccine. Verbal consent was obtained from the patient, vaccine administered by nurse, no sign of adverse reactions noted at this time. Patient education on arm soreness and use of tylenol or this patient  was discussed. Patient educated on the signs and symptoms of adverse effect and advise to contact the office if they occur.

## 2023-07-26 NOTE — Addendum Note (Signed)
Addended by: Margorie John on: 07/26/2023 11:12 AM   Modules accepted: Orders

## 2023-07-29 ENCOUNTER — Other Ambulatory Visit: Payer: 59

## 2023-07-29 ENCOUNTER — Telehealth: Payer: Self-pay | Admitting: Nurse Practitioner

## 2023-07-29 DIAGNOSIS — Z130 Encounter for screening for diseases of the blood and blood-forming organs and certain disorders involving the immune mechanism: Secondary | ICD-10-CM

## 2023-07-29 NOTE — Telephone Encounter (Signed)
Caller & Relationship to patient:  MRN #  [id number]   Call Back Number:   Date of Last Office Visit: 07/26/2023     Date of Next Office Visit: 07/29/2023    Medication(s) to be Refilled: metformin , fluticasone   Preferred Pharmacy: [facility] [email or web address]   ** Please notify patient to allow 48-72 hours to process** **Let patient know to contact pharmacy at the end of the day to make sure medication is ready. ** **If patient has not been seen in a year or longer, book an appointment **Advise to use MyChart for refill requests OR to contact their pharmacy

## 2023-07-30 LAB — CBC WITH DIFFERENTIAL/PLATELET
Basophils Absolute: 0 10*3/uL (ref 0.0–0.2)
Basos: 0 %
EOS (ABSOLUTE): 0.4 10*3/uL (ref 0.0–0.4)
Eos: 6 %
Hematocrit: 40 % (ref 34.0–46.6)
Hemoglobin: 12.7 g/dL (ref 11.1–15.9)
Immature Grans (Abs): 0 10*3/uL (ref 0.0–0.1)
Immature Granulocytes: 0 %
Lymphocytes Absolute: 2 10*3/uL (ref 0.7–3.1)
Lymphs: 28 %
MCH: 25.2 pg — ABNORMAL LOW (ref 26.6–33.0)
MCHC: 31.8 g/dL (ref 31.5–35.7)
MCV: 80 fL (ref 79–97)
Monocytes Absolute: 0.5 10*3/uL (ref 0.1–0.9)
Monocytes: 7 %
Neutrophils Absolute: 4.2 10*3/uL (ref 1.4–7.0)
Neutrophils: 59 %
Platelets: 308 10*3/uL (ref 150–450)
RBC: 5.03 x10E6/uL (ref 3.77–5.28)
RDW: 13.9 % (ref 11.7–15.4)
WBC: 7.2 10*3/uL (ref 3.4–10.8)

## 2023-07-30 LAB — CMP14+EGFR
ALT: 9 IU/L (ref 0–32)
AST: 10 IU/L (ref 0–40)
Albumin: 3.9 g/dL — ABNORMAL LOW (ref 4.0–5.0)
Alkaline Phosphatase: 59 IU/L (ref 44–121)
BUN/Creatinine Ratio: 14 (ref 9–23)
BUN: 11 mg/dL (ref 6–20)
Bilirubin Total: 0.3 mg/dL (ref 0.0–1.2)
CO2: 24 mmol/L (ref 20–29)
Calcium: 9.3 mg/dL (ref 8.7–10.2)
Chloride: 102 mmol/L (ref 96–106)
Creatinine, Ser: 0.78 mg/dL (ref 0.57–1.00)
Globulin, Total: 2.8 g/dL (ref 1.5–4.5)
Glucose: 107 mg/dL — ABNORMAL HIGH (ref 70–99)
Potassium: 4.7 mmol/L (ref 3.5–5.2)
Sodium: 139 mmol/L (ref 134–144)
Total Protein: 6.7 g/dL (ref 6.0–8.5)
eGFR: 105 mL/min/{1.73_m2} (ref >59)

## 2023-07-30 LAB — LIPID PANEL
Chol/HDL Ratio: 3.2 ratio (ref 0.0–4.4)
Cholesterol, Total: 160 mg/dL (ref 100–199)
HDL: 50 mg/dL (ref >39)
LDL Chol Calc (NIH): 97 mg/dL (ref 0–99)
Triglycerides: 68 mg/dL (ref 0–149)
VLDL Cholesterol Cal: 13 mg/dL (ref 5–40)

## 2023-07-30 LAB — HEPATITIS C ANTIBODY: Hep C Virus Ab: NONREACTIVE

## 2023-07-30 LAB — TSH: TSH: 2.31 u[IU]/mL (ref 0.450–4.500)

## 2023-09-12 ENCOUNTER — Ambulatory Visit: Payer: Self-pay | Admitting: Nurse Practitioner

## 2023-09-22 ENCOUNTER — Ambulatory Visit: Payer: 59 | Admitting: Internal Medicine

## 2024-02-15 ENCOUNTER — Ambulatory Visit
Admission: EM | Admit: 2024-02-15 | Discharge: 2024-02-15 | Disposition: A | Discharge status: Home / Self Care | Attending: Family Medicine | Admitting: Family Medicine

## 2024-02-15 DIAGNOSIS — J019 Acute sinusitis, unspecified: Secondary | ICD-10-CM

## 2024-02-15 MED ORDER — AMOXICILLIN 875 MG PO TABS
875.0000 mg | ORAL_TABLET | Freq: Two times a day (BID) | ORAL | 0 refills | Status: DC
Start: 2024-02-15 — End: 2024-10-05

## 2024-02-15 MED ORDER — CETIRIZINE HCL 10 MG PO TABS: 10.0000 mg | ORAL_TABLET | Freq: Every day | ORAL | 0 refills | Status: DC

## 2024-02-15 MED ORDER — PSEUDOEPHEDRINE HCL 60 MG PO TABS: 60.0000 mg | ORAL_TABLET | Freq: Three times a day (TID) | ORAL | 0 refills | Status: DC | PRN

## 2024-02-15 NOTE — ED Provider Notes (Signed)
 [facility]  Note:  This document was prepared using Conservation officer, historic buildings and may include unintentional dictation errors.  MRN: [id number] DOB: 1991/08/29  Subjective:   Jo Harvey is a 33 y.o. female presenting for 8 to 9-day history of acute onset persistent sinus congestion, sinus drainage, malaise and fatigue.  Has had minimal cough.  No chest pain, shortness of breath or wheezing.  No asthma.  No ear pain.  Has had minimal throat pain.  No smoking of any kind including cigarettes, cigars, vaping, marijuana use.    No current facility-administered medications for this encounter.  Current Outpatient Medications:    fexofenadine (ALLEGRA) 180 MG tablet, Take 1 tablet (180 mg total) by mouth daily., Disp: 90 tablet, Rfl: 0   fluticasone (FLONASE) 50 MCG/ACT nasal spray, Place 2 sprays into both nostrils daily., Disp: 16 g, Rfl: 1   metFORMIN (GLUCOPHAGE) 1000 MG tablet, Take 1 tablet (1,000 mg total) by mouth daily with breakfast., Disp: 90 tablet, Rfl: 3   Multiple Vitamin (MULTIVITAMIN ADULT PO), Take by mouth., Disp: , Rfl:    Allergies  Allergen Reactions   Pork-Derived Products Itching    Past Medical History:  Diagnosis Date   Hematuria, microscopic 02/28/2018   Migraine    Prediabetes    Sore throat (viral) 04/16/2022     History reviewed. No pertinent surgical history.  Family History  Problem Relation Age of Onset   Diabetes Mother    High blood pressure Mother    Diabetes Father    Allergic rhinitis Brother    Allergic rhinitis Maternal Grandmother    Breast cancer Neg Hx    Cervical cancer Neg Hx     Social History   Tobacco Use   Smoking status: Never    Passive exposure: Never   Smokeless tobacco: Never  Vaping Use   Vaping status: Never Used  Substance Use Topics   Alcohol use: No   Drug use: Never    ROS   Objective:   Vitals: BP 133/89 (BP Location: Right Arm)   Pulse 69   Temp 98.7 F (37.1 C)  (Oral)   Resp 16   LMP 02/07/2024   SpO2 98%   Physical Exam Constitutional:      General: She is not in acute distress.    Appearance: Normal appearance. She is well-developed and normal weight. She is not ill-appearing, toxic-appearing or diaphoretic.  HENT:     Head: Normocephalic and atraumatic.     Right Ear: Tympanic membrane, ear canal and external ear normal. No drainage or tenderness. No middle ear effusion. There is no impacted cerumen. Tympanic membrane is not erythematous or bulging.     Left Ear: Tympanic membrane, ear canal and external ear normal. No drainage or tenderness.  No middle ear effusion. There is no impacted cerumen. Tympanic membrane is not erythematous or bulging.     Nose: Congestion present. No rhinorrhea.     Mouth/Throat:     Mouth: Mucous membranes are moist. No oral lesions.     Pharynx: No pharyngeal swelling, oropharyngeal exudate, posterior oropharyngeal erythema or uvula swelling.     Tonsils: No tonsillar exudate or tonsillar abscesses.     Comments: Thick streaks of postnasal drainage overlying pharynx. Eyes:     General: No scleral icterus.       Right eye: No discharge.        Left eye: No discharge.     Extraocular Movements: Extraocular movements intact.  Right eye: Normal extraocular motion.     Left eye: Normal extraocular motion.     Conjunctiva/sclera: Conjunctivae normal.  Cardiovascular:     Rate and Rhythm: Normal rate and regular rhythm.     Heart sounds: Normal heart sounds. No murmur heard.    No friction rub. No gallop.  Pulmonary:     Effort: Pulmonary effort is normal. No respiratory distress.     Breath sounds: No stridor. No wheezing, rhonchi or rales.  Chest:     Chest wall: No tenderness.  Musculoskeletal:     Cervical back: Normal range of motion and neck supple.  Lymphadenopathy:     Cervical: No cervical adenopathy.  Skin:    General: Skin is warm and dry.  Neurological:     General: No focal deficit  present.     Mental Status: She is alert and oriented to person, place, and time.  Psychiatric:        Mood and Affect: Mood normal.        Behavior: Behavior normal.     Assessment and Plan :   PDMP not reviewed this encounter.  1. Acute sinusitis, recurrence not specified, unspecified location    Deferred imaging given clear cardiopulmonary exam, hemodynamically stable vital signs. Will start empiric treatment for sinusitis with amoxicillin.  Recommended supportive care otherwise. Counseled patient on potential for adverse effects with medications prescribed/recommended today, ER and return-to-clinic precautions discussed, patient verbalized understanding.    Wallis Bamberg, PA-C 02/15/24 1229

## 2024-02-15 NOTE — ED Triage Notes (Signed)
 Pt c/o prod cough and chest congestion x 1 week-denies fever-NAD-steady gait

## 2024-02-15 NOTE — Discharge Instructions (Addendum)
We will manage this as a sinus infection with amoxicillin. For sore throat or cough try using a honey-based tea. Use 3 teaspoons of honey with juice squeezed from half lemon. Place shaved pieces of ginger into 1/2-1 cup of water and warm over stove top. Then mix the ingredients and repeat every 4 hours as needed. Please take ibuprofen 600mg every 6 hours with food alternating with OR taken together with Tylenol 500mg-650mg every 6 hours for throat pain, fevers, aches and pains. Hydrate very well with at least 2 liters of water. Eat light meals such as soups (chicken and noodles, vegetable, chicken and wild rice).  Do not eat foods that you are allergic to.  Taking an antihistamine like Zyrtec can help against postnasal drainage, sinus congestion which can cause sinus pain, sinus headaches, throat pain, painful swallowing, coughing.  You can take this together with pseudoephedrine (Sudafed) at a dose of 60 mg 3 times a day or twice daily as needed for the same kind of nasal drip, congestion.  Use cough medication as needed.   

## 2024-03-27 ENCOUNTER — Ambulatory Visit
Admission: EM | Admit: 2024-03-27 | Discharge: 2024-03-27 | Disposition: A | Discharge status: Home / Self Care | Attending: Nurse Practitioner | Admitting: Nurse Practitioner

## 2024-03-27 DIAGNOSIS — J309 Allergic rhinitis, unspecified: Secondary | ICD-10-CM

## 2024-03-27 LAB — POCT RAPID STREP A (OFFICE): Rapid Strep A Screen: NEGATIVE

## 2024-03-27 LAB — POC COVID19/FLU A&B COMBO
Covid Antigen, POC: NEGATIVE
Influenza A Antigen, POC: NEGATIVE
Influenza B Antigen, POC: NEGATIVE

## 2024-03-27 MED ORDER — PSEUDOEPH-BROMPHEN-DM 30-2-10 MG/5ML PO SYRP: 10.0000 mL | ORAL_SOLUTION | Freq: Four times a day (QID) | ORAL | 0 refills | Status: DC | PRN

## 2024-03-27 MED ORDER — PREDNISONE 20 MG PO TABS: 40.0000 mg | ORAL_TABLET | Freq: Every day | ORAL | 0 refills | Status: AC

## 2024-03-27 NOTE — ED Triage Notes (Signed)
 Pt present with c/o a cough and sore throat X 2 days. Has taken Motrin, Zyrtec and used Nasal Spray

## 2024-03-27 NOTE — Discharge Instructions (Addendum)
 You have a sinus infection likely caused by allergies. Sinusitis is inflammation of the sinuses, and antibiotics are not needed in your case. You have been prescribed a steroid to help reduce inflammation, and a cough medicine that also contains a decongestant, which should help relieve your symptoms more effectively than Sudafed alone. Stop taking Sudafed while using this new medication. You may continue taking Zyrtec and Flonase as usual.  You can use Tylenol or ibuprofen if you experience pain or fever. Drink plenty of fluids to keep your urine pale yellow, which helps ensure you're staying well hydrated and can thin mucus. Using a cool mist humidifier to keep the humidity in your home above 50% may help you breathe easier. Steam inhalation for 10 to 15 minutes, several times a day, can also ease congestion. This can be done by sitting in the bathroom with a hot shower running or by using over-the-counter vapor shower tablets. Try to limit exposure to cold or dry air. When sleeping, keeping your head elevated may reduce post-nasal drainage. Getting enough rest each night will also support your recovery.

## 2024-03-27 NOTE — ED Provider Notes (Signed)
 [facility]    CSN: [id number] Arrival date & time: 03/27/24  1259      History   Chief Complaint Chief Complaint  Patient presents with   Sore Throat   Cough   Nasal Congestion    HPI Jo Harvey is a 33 y.o. female.   Subjective:   Jo Harvey is a 33 year old female presents with symptoms concerning for a possible sinus infection. Symptoms began yesterday and include runny nose, nasal congestion, sore throat, chills, and a productive cough. She denies fever, body aches, nausea, vomiting, diarrhea, shortness of breath, wheezing, or headache. No known sick contacts. She has a history of seasonal allergies and uses Flonase and Zyrtec daily, along with Sudafed as needed. She has also taken Motrin but reports minimal relief. She is staying well-hydrated and does not smoke.  The following portions of the patient's history were reviewed and updated as appropriate: allergies, current medications, past family history, past medical history, past social history, past surgical history, and problem list.       Past Medical History:  Diagnosis Date   Hematuria, microscopic 02/28/2018   Migraine    Prediabetes    Sore throat (viral) 04/16/2022    Patient Active Problem List   Diagnosis Date Noted   Need for diphtheria-tetanus-pertussis (Tdap) vaccine 07/26/2023   Screening for cervical cancer 07/26/2023   Annual physical exam 07/26/2023   Obesity (BMI 30-39.9) 07/26/2023   Weight loss counseling, encounter for 06/01/2022   Other allergic rhinitis 05/13/2022   Allergic conjunctivitis of both eyes 05/13/2022   Nose polyp 05/13/2022   Polyp of right nasal cavity 04/16/2022   Seasonal allergies 04/16/2022   Tinea pedis 09/22/2021   Pre-diabetes 04/16/2021   Lymph node enlargement 04/16/2021   Microcytic anemia 04/16/2021   Polyneuropathy 07/05/2019   Migraine with aura 09/29/2017   Healthcare maintenance 09/29/2017    History reviewed. No pertinent surgical  history.  OB History   No obstetric history on file.      Home Medications    Prior to Admission medications   Medication Sig Start Date End Date Taking? Authorizing Provider  brompheniramine-pseudoephedrine-DM 30-2-10 MG/5ML syrup Take 10 mLs by mouth every 6 (six) hours as needed (cough and congestion). 03/27/24  Yes Lurline Idol, FNP  predniSONE (DELTASONE) 20 MG tablet Take 2 tablets (40 mg total) by mouth daily for 5 days. 03/27/24 04/01/24 Yes Lurline Idol, FNP  amoxicillin (AMOXIL) 875 MG tablet Take 1 tablet (875 mg total) by mouth 2 (two) times daily. 02/15/24   Wallis Bamberg, PA-C  cetirizine (ZYRTEC ALLERGY) 10 MG tablet Take 1 tablet (10 mg total) by mouth daily. 02/15/24   Wallis Bamberg, PA-C  fexofenadine (ALLEGRA) 180 MG tablet Take 1 tablet (180 mg total) by mouth daily. 06/14/23   Wallis Bamberg, PA-C  fluticasone (FLONASE) 50 MCG/ACT nasal spray Place 2 sprays into both nostrils daily. 06/14/23   Wallis Bamberg, PA-C  metFORMIN (GLUCOPHAGE) 1000 MG tablet Take 1 tablet (1,000 mg total) by mouth daily with breakfast. 07/26/23 07/25/24  Donell Beers, FNP  Multiple Vitamin (MULTIVITAMIN ADULT PO) Take by mouth.    [provider]    Family History Family History  Problem Relation Age of Onset   Diabetes Mother    High blood pressure Mother    Diabetes Father    Allergic rhinitis Brother    Allergic rhinitis Maternal Grandmother    Breast cancer Neg Hx    Cervical cancer Neg Hx     Social  History Social History   Tobacco Use   Smoking status: Never    Passive exposure: Never   Smokeless tobacco: Never  Vaping Use   Vaping status: Never Used  Substance Use Topics   Alcohol use: No   Drug use: Never     Allergies   Pork-derived products   Review of Systems Review of Systems  Constitutional:  Negative for fever.  HENT:  Positive for congestion, postnasal drip, rhinorrhea, sneezing and sore throat. Negative for ear pain and trouble swallowing.    Respiratory:  Positive for cough. Negative for shortness of breath and wheezing.   Gastrointestinal:  Negative for diarrhea, nausea and vomiting.  Musculoskeletal:  Negative for myalgias.  Neurological:  Negative for headaches.  All other systems reviewed and are negative.    Physical Exam Triage Vital Signs ED Triage Vitals  Encounter Vitals Group     BP 03/27/24 1422 122/88     Systolic BP Percentile --      Diastolic BP Percentile --      Pulse Rate 03/27/24 1422 82     Resp 03/27/24 1422 17     Temp 03/27/24 1422 98.7 F (37.1 C)     Temp Source 03/27/24 1422 Oral     SpO2 03/27/24 1422 97 %     Weight --      Height --      Head Circumference --      Peak Flow --      Pain Score 03/27/24 1421 7     Pain Loc --      Pain Education --      Exclude from Growth Chart --    No data found.  Updated Vital Signs BP 122/88 (BP Location: Right Arm)   Pulse 82   Temp 98.7 F (37.1 C) (Oral)   Resp 17   LMP 03/15/2024 (Exact Date)   SpO2 97%   Visual Acuity Right Eye Distance:   Left Eye Distance:   Bilateral Distance:    Right Eye Near:   Left Eye Near:    Bilateral Near:     Physical Exam Vitals reviewed.  Constitutional:      General: She is awake. She is not in acute distress.    Appearance: Normal appearance. She is well-developed. She is not ill-appearing, toxic-appearing or diaphoretic.  HENT:     Head: Normocephalic.     Right Ear: Hearing, tympanic membrane, ear canal and external ear normal. No drainage, swelling or tenderness. No middle ear effusion. Tympanic membrane is not erythematous.     Left Ear: Hearing, tympanic membrane, ear canal and external ear normal. No drainage, swelling or tenderness.  No middle ear effusion. Tympanic membrane is not erythematous.     Nose: Congestion present.     Right Sinus: No maxillary sinus tenderness or frontal sinus tenderness.     Left Sinus: No maxillary sinus tenderness or frontal sinus tenderness.      Mouth/Throat:     Lips: Pink.     Mouth: Mucous membranes are moist.     Pharynx: Oropharynx is clear. Uvula midline. Posterior oropharyngeal erythema present. No pharyngeal swelling, oropharyngeal exudate or uvula swelling.     Tonsils: No tonsillar exudate or tonsillar abscesses.  Eyes:     General: Vision grossly intact.     Conjunctiva/sclera: Conjunctivae normal.  Cardiovascular:     Rate and Rhythm: Normal rate and regular rhythm.     Heart sounds: Normal heart sounds.  Pulmonary:  Effort: Pulmonary effort is normal.     Breath sounds: Normal breath sounds and air entry.  Musculoskeletal:        General: Normal range of motion.     Cervical back: Full passive range of motion without pain, normal range of motion and neck supple.  Lymphadenopathy:     Cervical: No cervical adenopathy.  Skin:    General: Skin is warm and dry.  Neurological:     General: No focal deficit present.     Mental Status: She is alert and oriented to person, place, and time.  Psychiatric:        Mood and Affect: Mood normal.        Behavior: Behavior normal. Behavior is cooperative.      UC Treatments / Results  Labs (all labs ordered are listed, but only abnormal results are displayed) Labs Reviewed  POC COVID19/FLU A&B COMBO  POCT RAPID STREP A (OFFICE)    EKG   Radiology No results found.  Procedures Procedures (including critical care time)  Medications Ordered in UC Medications - No data to display  Initial Impression / Assessment and Plan / UC Course  I have reviewed the triage vital signs and the nursing notes.  Pertinent labs & imaging results that were available during my care of the patient were reviewed by me and considered in my medical decision making (see chart for details).    33 year old female presenting with runny nose, nasal congestion, sore throat, chills, and productive cough. She is afebrile, nontoxic, and has a known history of chronic allergies. Physical  exam as noted above. Clinical presentation is consistent with allergic sinusitis. Antibiotics are not indicated at this time. Treatment plan includes a short course of steroids and Bromfed-DM for symptom relief. Advised to stop Sudafed. She may continue using Flonase and Zyrtec, along with other supportive care measures as needed.  Today's evaluation has revealed no signs of a dangerous process. Discussed diagnosis with patient and/or guardian. Patient and/or guardian aware of their diagnosis, possible red flag symptoms to watch out for and need for close follow up. Patient and/or guardian understands verbal and written discharge instructions. Patient and/or guardian comfortable with plan and disposition.  Patient and/or guardian has a clear mental status at this time, good insight into illness (after discussion and teaching) and has clear judgment to make decisions regarding their care  Documentation was completed with the aid of voice recognition software. Transcription may contain typographical errors. Final Clinical Impressions(s) / UC Diagnoses   Final diagnoses:  Allergic sinusitis     Discharge Instructions      You have a sinus infection likely caused by allergies. Sinusitis is inflammation of the sinuses, and antibiotics are not needed in your case. You have been prescribed a steroid to help reduce inflammation, and a cough medicine that also contains a decongestant, which should help relieve your symptoms more effectively than Sudafed alone. Stop taking Sudafed while using this new medication. You may continue taking Zyrtec and Flonase as usual.  You can use Tylenol or ibuprofen if you experience pain or fever. Drink plenty of fluids to keep your urine pale yellow, which helps ensure you're staying well hydrated and can thin mucus. Using a cool mist humidifier to keep the humidity in your home above 50% may help you breathe easier. Steam inhalation for 10 to 15 minutes, several times a  day, can also ease congestion. This can be done by sitting in the bathroom with a hot shower running  or by using over-the-counter vapor shower tablets. Try to limit exposure to cold or dry air. When sleeping, keeping your head elevated may reduce post-nasal drainage. Getting enough rest each night will also support your recovery.     ED Prescriptions     Medication Sig Dispense Auth. Provider   predniSONE (DELTASONE) 20 MG tablet Take 2 tablets (40 mg total) by mouth daily for 5 days. 10 tablet Maryruth Sol, FNP   brompheniramine-pseudoephedrine-DM 30-2-10 MG/5ML syrup Take 10 mLs by mouth every 6 (six) hours as needed (cough and congestion). 120 mL Maryruth Sol, FNP      PDMP not reviewed this encounter.   Maryruth Sol, [place] 03/27/24 1606

## 2024-06-21 NOTE — Progress Notes (Signed)
 ANNUAL EXAM Patient name: Jo Harvey MRN [id number]  Date of birth: 01/10/1991 Chief Complaint:   No chief complaint on file.  History of Present Illness:   Jo Harvey is a 33 y.o. No obstetric history on file. being seen today for a routine annual exam.  Current complaints: chin hair  Menstrual concerns? Yes  regular monthly cycles, no changes in frequency Breast or nipple changes? No  Contraception use? Yes abstinence Sexually active? No    Increased hair growth on the chin as well as increassed acne break out which is not normal. No changes in menstrual frequency. No changes in medication. Weight has remained stable. No changes in bowel habits G0 - no prior attempt at pregnancy Menses have always been pretty irregualr   No LMP recorded.   The pregnancy intention screening data noted above was reviewed. Potential methods of contraception were discussed. The patient elected to proceed with No data recorded.   Last pap     Component Value Date/Time   DIAGPAP  09/29/2017 0000    NEGATIVE FOR INTRAEPITHELIAL LESIONS OR MALIGNANCY.   DIAGPAP  09/29/2017 0000    FUNGAL ORGANISMS PRESENT CONSISTENT WITH CANDIDA SPP.   ADEQPAP  09/29/2017 0000    Satisfactory for evaluation  endocervical/transformation zone component PRESENT.    Last mammogram: n/a.  Last colonoscopy: n/a.      07/26/2023   10:08 AM 04/16/2021    3:58 PM 07/05/2019    3:35 PM 02/28/2018    3:24 PM 02/20/2018    2:02 PM  Depression screen PHQ 2/9  Decreased Interest 0 0 0 0 0  Down, Depressed, Hopeless 0 0 0 0 0  PHQ - 2 Score 0 0 0 0 0  Altered sleeping  0     Tired, decreased energy  0     Change in appetite  0     Feeling bad or failure about yourself   0     Trouble concentrating  0     Moving slowly or fidgety/restless  0     Suicidal thoughts  0     PHQ-9 Score  0           06/22/2024   12:24 PM  GAD 7 : Generalized Anxiety Score  Nervous, Anxious, on Edge 0  Control/stop worrying 1  Worry  too much - different things 1  Trouble relaxing 0  Restless 0  Easily annoyed or irritable 0  Afraid - awful might happen 0  Total GAD 7 Score 2     Review of Systems:   Pertinent items are noted in HPI Denies any headaches, blurred vision, fatigue, shortness of breath, chest pain, abdominal pain, abnormal vaginal discharge/itching/odor/irritation, problems with periods, bowel movements, urination, or intercourse unless otherwise stated above. Pertinent History Reviewed:  Reviewed past medical,surgical, social and family history.  Reviewed problem list, medications and allergies. Physical Assessment:   Vitals:   06/22/24 0943  BP: 123/82  Pulse: 89  Weight: 196 lb 11.2 oz (89.2 kg)  There is no height or weight on file to calculate BMI.        Physical Examination:   General appearance - well appearing, and in no distress  Mental status - alert, oriented to person, place, and time  Psych:  She has a normal mood and affect  Skin - warm and dry, normal color, no suspicious lesions noted  Chest - effort normal, all lung fields clear to auscultation bilaterally  Heart - normal rate and  regular rhythm  Breasts - breasts appear normal, no suspicious masses, no skin or nipple changes or  axillary nodes  Abdomen - soft, nontender, nondistended, no masses or organomegaly  Pelvic -  VULVA: normal appearing vulva with no masses, tenderness or lesions   VAGINA: normal appearing vagina with normal color and discharge, no lesions   CERVIX: normal appearing cervix without discharge or lesions, no CMT  Thin prep pap is done with HR HPV cotesting  UTERUS: uterus is felt to be normal size, shape, consistency and nontender   ADNEXA: No adnexal masses or tenderness noted.  Extremities:  No swelling or varicosities noted  Chaperone present for exam  No results found for this or any previous visit (from the past 24 hours).    Assessment & Plan:  1. Well woman exam with routine gynecological  exam (Primary) - Cervical cancer screening: Discussed guidelines. Pap with HPV collected - GC/CT: not indicated - Birth Control: abstience - Breast Health: Encouraged self breast awareness/SBE. Teaching provided.  - F/U 12 months and prn  2. Hirsutism Labs for further investigation. No additional symptoms of hyperandrogenism.  - Testosterone ,Free and Total - Follicle stimulating hormone - TSH Rfx on Abnormal to Free T4  3. Screening for cervical cancer Pap collected - Cytology - PAP   Orders Placed This Encounter  Procedures   Testosterone ,Free and Total   Follicle stimulating hormone   TSH Rfx on Abnormal to Free T4    Meds: No orders of the defined types were placed in this encounter.   Follow-up: No follow-ups on file.  Carter Quarry, MD 06/21/2024 10:41 PM

## 2024-06-22 ENCOUNTER — Other Ambulatory Visit: Payer: Self-pay

## 2024-06-22 ENCOUNTER — Other Ambulatory Visit (HOSPITAL_COMMUNITY)
Admission: RE | Admit: 2024-06-22 | Discharge: 2024-06-22 | Disposition: A | Discharge status: Home / Self Care | Source: Ambulatory Visit | Attending: Obstetrics and Gynecology | Admitting: Obstetrics and Gynecology

## 2024-06-22 ENCOUNTER — Encounter: Payer: Self-pay | Admitting: Obstetrics and Gynecology

## 2024-06-22 ENCOUNTER — Ambulatory Visit: Admitting: Obstetrics and Gynecology

## 2024-06-22 VITALS — BP 123/82 | HR 89 | Wt 196.7 lb

## 2024-06-22 DIAGNOSIS — Z124 Encounter for screening for malignant neoplasm of cervix: Secondary | ICD-10-CM | POA: Diagnosis present

## 2024-06-22 DIAGNOSIS — Z01419 Encounter for gynecological examination (general) (routine) without abnormal findings: Secondary | ICD-10-CM | POA: Diagnosis not present

## 2024-06-22 DIAGNOSIS — L680 Hirsutism: Secondary | ICD-10-CM | POA: Diagnosis not present

## 2024-06-23 LAB — TSH RFX ON ABNORMAL TO FREE T4: TSH: 2.24 u[IU]/mL (ref 0.450–4.500)

## 2024-06-23 LAB — TESTOSTERONE,FREE AND TOTAL
Testosterone, Free: 1.8 pg/mL (ref 0.0–4.2)
Testosterone: 25 ng/dL (ref 13–71)

## 2024-06-23 LAB — FOLLICLE STIMULATING HORMONE: FSH: 3.9 m[IU]/mL

## 2024-06-27 ENCOUNTER — Ambulatory Visit: Payer: Self-pay | Admitting: Obstetrics and Gynecology

## 2024-06-27 LAB — CYTOLOGY - PAP
Adequacy: ABSENT
Comment: NEGATIVE
Diagnosis: NEGATIVE
High risk HPV: NEGATIVE

## 2024-07-25 ENCOUNTER — Ambulatory Visit: Payer: Self-pay | Admitting: Nurse Practitioner

## 2024-09-16 ENCOUNTER — Other Ambulatory Visit: Payer: Self-pay | Admitting: Nurse Practitioner

## 2024-09-16 DIAGNOSIS — R7303 Prediabetes: Secondary | ICD-10-CM

## 2024-10-03 ENCOUNTER — Telehealth: Payer: Self-pay | Admitting: Nurse Practitioner

## 2024-10-03 NOTE — Telephone Encounter (Unsigned)
 Copied from CRM #[id number]. Topic: Clinical - Lab/Test Results >> Oct 03, 2024  8:46 AM Tiffini S wrote: Reason for CRM: Patient called about appointment scheduled for 10/05/24- wants to have her A1C checked and asked for a call back from the clinic nurse at [phone] Wants to know details about fasting requirements for labs

## 2024-10-04 NOTE — Telephone Encounter (Signed)
 Called patient unable to make contact, voicemail has been left

## 2024-10-05 ENCOUNTER — Ambulatory Visit (INDEPENDENT_AMBULATORY_CARE_PROVIDER_SITE_OTHER): Payer: Self-pay | Admitting: Nurse Practitioner

## 2024-10-05 VITALS — BP 116/89 | HR 84 | Wt 204.0 lb

## 2024-10-05 DIAGNOSIS — Z130 Encounter for screening for diseases of the blood and blood-forming organs and certain disorders involving the immune mechanism: Secondary | ICD-10-CM

## 2024-10-05 DIAGNOSIS — Z13228 Encounter for screening for other metabolic disorders: Secondary | ICD-10-CM

## 2024-10-05 DIAGNOSIS — R7303 Prediabetes: Secondary | ICD-10-CM

## 2024-10-05 DIAGNOSIS — J302 Other seasonal allergic rhinitis: Secondary | ICD-10-CM

## 2024-10-05 DIAGNOSIS — E669 Obesity, unspecified: Secondary | ICD-10-CM

## 2024-10-05 DIAGNOSIS — Z1329 Encounter for screening for other suspected endocrine disorder: Secondary | ICD-10-CM

## 2024-10-05 DIAGNOSIS — Z0000 Encounter for general adult medical examination without abnormal findings: Secondary | ICD-10-CM | POA: Diagnosis not present

## 2024-10-05 DIAGNOSIS — Z1321 Encounter for screening for nutritional disorder: Secondary | ICD-10-CM

## 2024-10-05 LAB — POCT GLYCOSYLATED HEMOGLOBIN (HGB A1C): Hemoglobin A1C: 6 % — AB (ref 4.0–5.6)

## 2024-10-05 MED ORDER — CETIRIZINE HCL 10 MG PO TABS: 10.0000 mg | ORAL_TABLET | Freq: Every day | ORAL | 1 refills | Status: DC

## 2024-10-05 MED ORDER — METFORMIN HCL 1000 MG PO TABS
1000.0000 mg | ORAL_TABLET | Freq: Every day | ORAL | 3 refills | Status: DC
Start: 2024-10-05 — End: 2024-10-18

## 2024-10-05 MED ORDER — FLUTICASONE PROPIONATE 50 MCG/ACT NA SUSP: 2.0000 | Freq: Every day | NASAL | 2 refills | Status: AC

## 2024-10-05 NOTE — Patient Instructions (Signed)
 1. Pre-diabetes (Primary)  - POCT glycosylated hemoglobin (Hb A1C) - VITAMIN D 25 Hydroxy (Vit-D Deficiency, Fractures) - metFORMIN  (GLUCOPHAGE ) 1000 MG tablet; Take 1 tablet (1,000 mg total) by mouth daily with breakfast.  Dispense: 90 tablet; Refill: 3  2. Seasonal allergies  - cetirizine  (ZYRTEC  ALLERGY) 10 MG tablet; Take 1 tablet (10 mg total) by mouth daily.  Dispense: 90 tablet; Refill: 1  3. Screening for endocrine, nutritional, metabolic and immunity disorder  - CBC - CMP14+EGFR - Lipid panel    It is important that you exercise regularly at least 30 minutes 5 times a week as tolerated  Think about what you will eat, plan ahead. Choose  clean, green, fresh or frozen over canned, processed or packaged foods which are more sugary, salty and fatty. 70 to 75% of food eaten should be vegetables and fruit. Three meals at set times with snacks allowed between meals, but they must be fruit or vegetables. Aim to eat over a 12 hour period , example 7 am to 7 pm, and STOP after  your last meal of the day. Drink water,generally about 64 ounces per day, no other drink is as healthy. Fruit juice is best enjoyed in a healthy way, by EATING the fruit.  Thanks for choosing [facility] we consider it a privelige to serve you.

## 2024-10-05 NOTE — Assessment & Plan Note (Signed)
 Lab Results  Component Value Date   HGBA1C 6.0 (A) 10/05/2024   A1c at 6.0% indicates prediabetes. Managed with metformin  1000 mg daily. Water retention and facial puffiness occur when metformin  is skipped, resolving upon resumption. - Continue metformin  1000 mg daily. - Encourage low-carb diet with 70-80% vegetables and protein, reduced carbohydrates, and portion control. - Advise 30 minutes of moderate to vigorous exercise, 5 days a week.

## 2024-10-05 NOTE — Assessment & Plan Note (Signed)
 Continues Flonase  nasal spray 1 spray into both nostrils daily, Zyrtec  10 mg daily as needed

## 2024-10-05 NOTE — Assessment & Plan Note (Signed)
 Routine visit. Blood pressure controlled at 116/89 mmHg. No new allergies, family history changes, diagnoses, or surgeries. No substance use. Living with family. - Perform complete blood work: CBC, CMP, lipid panel. - Encourage seatbelt use in cars. - Discuss annual flu vaccination importance. - Check HPV vaccination status and print vaccine record. - Encourage 7-8 hours of sleep nightly. Counseled on a heart healthy low-salt low-fat diet

## 2024-10-05 NOTE — Assessment & Plan Note (Deleted)
 Continues Flonase  nasal spray 1 spray into both nostrils daily, Zyrtec  10 mg daily as needed

## 2024-10-05 NOTE — Progress Notes (Signed)
 Complete physical exam  Patient: Jo Harvey   DOB: 07/17/1991   33 y.o. Female  MRN: [id number]  Subjective:    Chief Complaint  Patient presents with   Prediabetes    Pt wants a1c   Discussed the use of AI scribe software for clinical note transcription with the patient, who gave verbal consent to proceed.  History of Present Illness Jo Harvey is a 33 year old female  has a past medical history of Hematuria, microscopic (02/28/2018), Migraine, Prediabetes, and Sore throat (viral) (04/16/2022).  who presents for an annual physical exam.  She is currently taking metformin  1000 mg daily with breakfast for prediabetes. If she skips her metformin  for a few days to a week, she notices her body retains more water, particularly in her face, which appears puffier. However, once she resumes taking metformin , the puffiness subsides. No fever, chills, chest pain, shortness of breath, or swelling in her face.  She uses Flonase  nasal spray, one spray daily, and Zyrtec  10 mg as needed for allergies. No new medication allergies have been reported.  Her recent A1c test was 6.0. She has not eaten today as she was preparing for a fasting glucose test.  In terms of physical activity, she mentions periods of being active, such as going to the gym or walking after dinner. She does not follow a specific diet at home.  She denies any alcohol consumption, smoking, vaping, or drug use. She lives with her family, specifically her parents.  There have been no changes in her family history or any new diagnoses for family members. She has not had any surgeries since her last visit.    Assessment & Plan     Most recent fall risk assessment:    06/01/2022    3:52 PM  Fall Risk   Falls in the past year? 0  Number falls in past yr: 0  Injury with Fall? 0  Risk for fall due to : No Fall Risks  Follow up Falls evaluation completed;Falls prevention discussed      Data saved with a previous flowsheet row  definition     Most recent depression screenings:    10/05/2024    1:25 PM 06/22/2024   12:23 PM  PHQ 2/9 Scores  PHQ - 2 Score 0 1  PHQ- 9 Score 0 5        Patient Care Team: Leray Garverick R, FNP as PCP - General (Nurse Practitioner)   Outpatient Medications Prior to Visit  Medication Sig   Multiple Vitamin (MULTIVITAMIN ADULT PO) Take by mouth.   [DISCONTINUED] cetirizine  (ZYRTEC  ALLERGY) 10 MG tablet Take 1 tablet (10 mg total) by mouth daily.   [DISCONTINUED] fluticasone  (FLONASE ) 50 MCG/ACT nasal spray Place 2 sprays into both nostrils daily.   [DISCONTINUED] amoxicillin  (AMOXIL ) 875 MG tablet Take 1 tablet (875 mg total) by mouth 2 (two) times daily. (Patient not taking: Reported on 06/22/2024)   [DISCONTINUED] brompheniramine-pseudoephedrine -DM 30-2-10 MG/5ML syrup Take 10 mLs by mouth every 6 (six) hours as needed (cough and congestion).   [DISCONTINUED] fexofenadine  (ALLEGRA ) 180 MG tablet Take 1 tablet (180 mg total) by mouth daily. (Patient not taking: Reported on 06/22/2024)   [DISCONTINUED] metFORMIN  (GLUCOPHAGE ) 1000 MG tablet Take 1 tablet (1,000 mg total) by mouth daily with breakfast. (Patient not taking: Reported on 10/05/2024)   No facility-administered medications prior to visit.    Review of Systems  Constitutional:  Negative for appetite change, chills, fatigue and fever.  HENT:  Negative for  congestion, postnasal drip, rhinorrhea and sneezing.   Eyes:  Negative for pain, discharge and itching.  Respiratory:  Negative for cough, shortness of breath and wheezing.   Cardiovascular:  Negative for chest pain, palpitations and leg swelling.  Gastrointestinal:  Negative for abdominal pain, constipation, nausea and vomiting.  Endocrine: Negative for cold intolerance, heat intolerance and polydipsia.  Genitourinary:  Negative for difficulty urinating, dysuria, flank pain and frequency.  Musculoskeletal:  Negative for arthralgias, back pain, joint swelling and  myalgias.  Skin:  Negative for color change, pallor, rash and wound.  Allergic/Immunologic: Positive for environmental allergies and food allergies.  Neurological:  Negative for dizziness, facial asymmetry, weakness, numbness and headaches.  Psychiatric/Behavioral:  Negative for behavioral problems, confusion, self-injury and suicidal ideas.        Objective:     BP 116/89   Pulse 84   Wt 204 lb (92.5 kg)   SpO2 100%   BMI 31.95 kg/m    Physical Exam Vitals and nursing note reviewed.  Constitutional:      General: She is not in acute distress.    Appearance: Normal appearance. She is obese. She is not ill-appearing, toxic-appearing or diaphoretic.  HENT:     Mouth/Throat:     Mouth: Mucous membranes are moist.     Pharynx: Oropharynx is clear. No oropharyngeal exudate or posterior oropharyngeal erythema.  Eyes:     General: No scleral icterus.       Right eye: No discharge.        Left eye: No discharge.     Extraocular Movements: Extraocular movements intact.     Conjunctiva/sclera: Conjunctivae normal.  Cardiovascular:     Rate and Rhythm: Normal rate and regular rhythm.     Pulses: Normal pulses.     Heart sounds: Normal heart sounds. No murmur heard.    No friction rub. No gallop.  Pulmonary:     Effort: Pulmonary effort is normal. No respiratory distress.     Breath sounds: Normal breath sounds. No stridor. No wheezing, rhonchi or rales.  Chest:     Chest wall: No tenderness.  Abdominal:     General: There is no distension.     Palpations: Abdomen is soft.     Tenderness: There is no abdominal tenderness. There is no right CVA tenderness, left CVA tenderness or guarding.  Musculoskeletal:        General: No swelling, tenderness, deformity or signs of injury.     Right lower leg: No edema.     Left lower leg: No edema.  Skin:    General: Skin is warm and dry.     Capillary Refill: Capillary refill takes less than 2 seconds.     Coloration: Skin is not  jaundiced or pale.     Findings: No bruising, erythema or lesion.  Neurological:     Mental Status: She is alert and oriented to person, place, and time.     Motor: No weakness.     Coordination: Coordination normal.     Gait: Gait normal.  Psychiatric:        Mood and Affect: Mood normal.        Behavior: Behavior normal.        Thought Content: Thought content normal.        Judgment: Judgment normal.     Results for orders placed or performed in visit on 10/05/24  POCT glycosylated hemoglobin (Hb A1C)  Result Value Ref Range   Hemoglobin A1C 6.0 (A)  4.0 - 5.6 %   HbA1c POC (<> result, manual entry)     HbA1c, POC (prediabetic range)     HbA1c, POC (controlled diabetic range)         Assessment & Plan:    Routine Health Maintenance and Physical Exam  Immunization History  Administered Date(s) Administered   PFIZER(Purple Top)SARS-COV-2 Vaccination 10/14/2020   Tdap 07/26/2023    Health Maintenance  Topic Date Due   Hepatitis B Vaccines 19-59 Average Risk (1 of 3 - 19+ 3-dose series) Never done   HPV VACCINES (1 - 3-dose SCDM series) Never done   COVID-19 Vaccine (2 - 2025-26 season) 08/13/2024   Influenza Vaccine  03/12/2025 (Originally 07/13/2024)   Cervical Cancer Screening (HPV/Pap Cotest)  06/22/2029   DTaP/Tdap/Td (2 - Td or Tdap) 07/25/2033   Hepatitis C Screening  Completed   HIV Screening  Completed   Pneumococcal Vaccine  Aged Out   Meningococcal B Vaccine  Aged Out    Discussed health benefits of physical activity, and encouraged her to engage in regular exercise appropriate for her age and condition.  Problem List Items Addressed This Visit       Other   Pre-diabetes   Lab Results  Component Value Date   HGBA1C 6.0 (A) 10/05/2024   A1c at 6.0% indicates prediabetes. Managed with metformin  1000 mg daily. Water retention and facial puffiness occur when metformin  is skipped, resolving upon resumption. - Continue metformin  1000 mg daily. -  Encourage low-carb diet with 70-80% vegetables and protein, reduced carbohydrates, and portion control. - Advise 30 minutes of moderate to vigorous exercise, 5 days a week.       Relevant Medications   metFORMIN  (GLUCOPHAGE ) 1000 MG tablet   Other Relevant Orders   POCT glycosylated hemoglobin (Hb A1C) (Completed)   VITAMIN D 25 Hydroxy (Vit-D Deficiency, Fractures)   Seasonal allergies   Continues Flonase  nasal spray 1 spray into both nostrils daily, Zyrtec  10 mg daily as needed      Relevant Medications   cetirizine  (ZYRTEC  ALLERGY) 10 MG tablet   Annual physical exam - Primary   Routine visit. Blood pressure controlled at 116/89 mmHg. No new allergies, family history changes, diagnoses, or surgeries. No substance use. Living with family. - Perform complete blood work: CBC, CMP, lipid panel. - Encourage seatbelt use in cars. - Discuss annual flu vaccination importance. - Check HPV vaccination status and print vaccine record. - Encourage 7-8 hours of sleep nightly. Counseled on a heart healthy low-salt low-fat diet      Obesity (BMI 30-39.9)   Wt Readings from Last 3 Encounters:  10/05/24 204 lb (92.5 kg)  06/22/24 196 lb 11.2 oz (89.2 kg)  07/26/23 196 lb (88.9 kg)   Body mass index is 31.95 kg/m.  Obesity management discussed with emphasis on diet and exercise for weight and blood sugar control. - Encourage a low carb diet  - Advise regular moderate exercise at least 30 minutes 5 days a week       Relevant Medications   metFORMIN  (GLUCOPHAGE ) 1000 MG tablet   Other Relevant Orders   VITAMIN D 25 Hydroxy (Vit-D Deficiency, Fractures)   Other Visit Diagnoses       Screening for endocrine, nutritional, metabolic and immunity disorder       Relevant Orders   CBC   CMP14+EGFR   Lipid panel      Return in about 1 year (around 10/05/2025) for CPE.     Devaney Segers R Marguarite Markov, FNP

## 2024-10-05 NOTE — Assessment & Plan Note (Addendum)
 Wt Readings from Last 3 Encounters:  10/05/24 204 lb (92.5 kg)  06/22/24 196 lb 11.2 oz (89.2 kg)  07/26/23 196 lb (88.9 kg)   Body mass index is 31.95 kg/m.  Obesity management discussed with emphasis on diet and exercise for weight and blood sugar control. - Encourage a low carb diet  - Advise regular moderate exercise at least 30 minutes 5 days a week

## 2024-10-06 LAB — LIPID PANEL
Chol/HDL Ratio: 4 ratio (ref 0.0–4.4)
Cholesterol, Total: 198 mg/dL (ref 100–199)
HDL: 50 mg/dL (ref >39)
LDL Chol Calc (NIH): 139 mg/dL — ABNORMAL HIGH (ref 0–99)
Triglycerides: 48 mg/dL (ref 0–149)
VLDL Cholesterol Cal: 9 mg/dL (ref 5–40)

## 2024-10-06 LAB — CBC
Hematocrit: 42.7 % (ref 34.0–46.6)
Hemoglobin: 13.7 g/dL (ref 11.1–15.9)
MCH: 25.3 pg — ABNORMAL LOW (ref 26.6–33.0)
MCHC: 32.1 g/dL (ref 31.5–35.7)
MCV: 79 fL (ref 79–97)
Platelets: 373 x10E3/uL (ref 150–450)
RBC: 5.41 x10E6/uL — ABNORMAL HIGH (ref 3.77–5.28)
RDW: 13.7 % (ref 11.7–15.4)
WBC: 6.2 x10E3/uL (ref 3.4–10.8)

## 2024-10-06 LAB — CMP14+EGFR
ALT: 10 IU/L (ref 0–32)
AST: 13 IU/L (ref 0–40)
Albumin: 4.5 g/dL (ref 4.0–5.0)
Alkaline Phosphatase: 64 IU/L (ref 41–116)
BUN/Creatinine Ratio: 16 (ref 9–23)
BUN: 11 mg/dL (ref 6–20)
Bilirubin Total: 0.3 mg/dL (ref 0.0–1.2)
CO2: 23 mmol/L (ref 20–29)
Calcium: 10.1 mg/dL (ref 8.7–10.2)
Chloride: 101 mmol/L (ref 96–106)
Creatinine, Ser: 0.67 mg/dL (ref 0.57–1.00)
Globulin, Total: 3.2 g/dL (ref 1.5–4.5)
Glucose: 81 mg/dL (ref 70–99)
Potassium: 5 mmol/L (ref 3.5–5.2)
Sodium: 139 mmol/L (ref 134–144)
Total Protein: 7.7 g/dL (ref 6.0–8.5)
eGFR: 121 mL/min/1.73 (ref >59)

## 2024-10-06 LAB — VITAMIN D 25 HYDROXY (VIT D DEFICIENCY, FRACTURES): Vit D, 25-Hydroxy: 29.7 ng/mL — ABNORMAL LOW (ref 30.0–100.0)

## 2024-10-08 ENCOUNTER — Ambulatory Visit: Payer: Self-pay | Admitting: Nurse Practitioner

## 2024-10-18 ENCOUNTER — Telehealth: Payer: Self-pay | Admitting: Nurse Practitioner

## 2024-10-18 ENCOUNTER — Other Ambulatory Visit: Payer: Self-pay

## 2024-10-18 DIAGNOSIS — R7303 Prediabetes: Secondary | ICD-10-CM

## 2024-10-18 DIAGNOSIS — J302 Other seasonal allergic rhinitis: Secondary | ICD-10-CM

## 2024-10-18 MED ORDER — CETIRIZINE HCL 10 MG PO TABS: 10.0000 mg | ORAL_TABLET | Freq: Every day | ORAL | 0 refills | Status: AC

## 2024-10-18 MED ORDER — METFORMIN HCL 1000 MG PO TABS: 1000.0000 mg | ORAL_TABLET | Freq: Every day | ORAL | 0 refills | Status: AC

## 2024-10-18 NOTE — Telephone Encounter (Signed)
 Done [facility]

## 2024-10-18 NOTE — Telephone Encounter (Signed)
 Copied from CRM [id number]. Topic: Clinical - Prescription Issue >> Oct 17, 2024 12:19 PM Graeme ORN wrote: Reason for CRM: Patient called - states was traveling and luggage was lost - has been a week. Requesting new Rx for metFORMIN  (GLUCOPHAGE ) 1000 MG tablet and cetirizine  (ZYRTEC  ALLERGY) 10 MG tablet - Is currently in [place]  would like sent to [facility] Address: [address] [place], [place] [address] Phone: [phone] - is relocating to this place. Thank You

## 2024-10-18 NOTE — Telephone Encounter (Signed)
 Pt was advised that script was sent in for 90 days to cover her until she finds a new provider. KH

## 2025-01-11 ENCOUNTER — Ambulatory Visit: Payer: Self-pay

## 2025-01-11 NOTE — Telephone Encounter (Signed)
 Pt has been contacted and advised ED. She states that she is heading there now.

## 2025-01-11 NOTE — Telephone Encounter (Signed)
 Pt with a history of DM 2 reports worsening, pitting pedal edema x 1 week that does not improve with elevation, also reports her ankles/feet are darker than the rest of her skin and very cold. Pt advised to seek care at ED, pt agreeable.   FYI Only or Action Required?: FYI only for provider: ED advised.  Patient was last seen in primary care on 10/05/2024 by Paseda, Folashade R, FNP.  Called Nurse Triage reporting Foot Swelling.  Symptoms began a week ago.  Interventions attempted: Other: elevation.  Symptoms are: gradually worsening.  Triage Disposition: Go to ED Now (Notify PCP)  Patient/caregiver understands and will follow disposition?: Yes   Reason for Triage: patient has leg swelling and the right leg is worst and also discoloration at the top of her foot and the patient has been having the swelling for a week and it seems to get worse and also when she touches her leg it leaves an indent  Patient num Mobile [phone]     Reason for Disposition  Entire foot is cool or blue in comparison to other side  Answer Assessment - Initial Assessment Questions 1. SYMPTOM: What's the main symptom you're concerned about? (e.g., rash, sore, callus, drainage, numbness)     Bilateral pitting foot/ankle edema  2. LOCATION: Where is the  swelling located? (e.g., foot/toe, top/bottom, left/right)     Bilat ankles/feet  3. APPEARANCE: What does the area look like? (e.g., normal, red, swollen; size)     The color has changed, from my ankles down, it's darker than the rest of my feet.  4. ONSET: When did the  symptoms  start?     1 week  5. PAIN: Is there any pain? If Yes, ask: How bad is it? (Scale: 0-10; none, mild, moderate, severe)     Soreness  6. CAUSE: What do you think is causing the symptoms?     Pt suspects it is related to her DM  7. BLOOD GLUCOSE: What is your blood glucose level?      Pt does not check   9. OTHER SYMPTOMS: Do you have any other  symptoms? (e.g., fever, weakness)     Feet are very cold  Protocols used: Diabetes - Foot Problems and Questions-A-AH

## 2025-01-17 ENCOUNTER — Telehealth: Payer: Self-pay

## 2025-01-17 NOTE — Transitions of Care (Post Inpatient/ED Visit) (Cosign Needed)
" ° °  01/17/2025  Name: Jo Harvey MRN: [id number] DOB: 12/25/1990  Today's TOC FU Call Status: Today's TOC FU Call Status:: Successful TOC FU Call Completed TOC FU Call Complete Date: 01/17/25  Patient's Name and Date of Birth confirmed. Name, DOB  Transition Care Management Follow-up Telephone Call Date of Discharge: 01/11/25 Discharge Facility: Other (Non[facility] Facility) Name of Other ([facility]) Discharge Facility: [facility] Type of Discharge: Emergency Department How have you been since you were released from the hospital?: Better Any questions or concerns?: No  Items Reviewed: Any new allergies since your discharge?: No Dietary orders reviewed?: No Do you have support at home?: Yes  Medications Reviewed Today: Medications Reviewed Today   Medications were not reviewed in this encounter     Home Care and Equipment/Supplies: Were Home Health Services Ordered?: NA Any new equipment or medical supplies ordered?: NA  Functional Questionnaire:    Follow up appointments reviewed:  Pt will call back to schedule due to being [place] of state.     SIGNATURE. Suzen Shove   CMA II   "

## 2025-10-07 ENCOUNTER — Encounter: Payer: Self-pay | Admitting: Nurse Practitioner
